# Patient Record
Sex: Female | Born: 1940 | Race: White | Hispanic: No | Marital: Married | Smoking: Never smoker
Health system: Southern US, Community
[De-identification: ages and names within clinical notes are randomized; demographics above are authoritative.]

## PROBLEM LIST (undated history)

## (undated) DIAGNOSIS — M810 Age-related osteoporosis without current pathological fracture: Secondary | ICD-10-CM

## (undated) DIAGNOSIS — E039 Hypothyroidism, unspecified: Secondary | ICD-10-CM

## (undated) DIAGNOSIS — M1990 Unspecified osteoarthritis, unspecified site: Secondary | ICD-10-CM

## (undated) DIAGNOSIS — IMO0002 Reserved for concepts with insufficient information to code with codable children: Secondary | ICD-10-CM

## (undated) DIAGNOSIS — G4700 Insomnia, unspecified: Secondary | ICD-10-CM

## (undated) DIAGNOSIS — C55 Malignant neoplasm of uterus, part unspecified: Secondary | ICD-10-CM

## (undated) DIAGNOSIS — C50919 Malignant neoplasm of unspecified site of unspecified female breast: Secondary | ICD-10-CM

## (undated) HISTORY — PX: MASTECTOMY: SHX3

## (undated) HISTORY — DX: Unspecified osteoarthritis, unspecified site: M19.90

## (undated) HISTORY — DX: Malignant neoplasm of unspecified site of unspecified female breast: C50.919

## (undated) HISTORY — DX: Age-related osteoporosis without current pathological fracture: M81.0

## (undated) HISTORY — DX: Hypothyroidism, unspecified: E03.9

## (undated) HISTORY — PX: PLACEMENT OF BREAST IMPLANTS: SHX6334

## (undated) HISTORY — DX: Reserved for concepts with insufficient information to code with codable children: IMO0002

## (undated) HISTORY — DX: Malignant neoplasm of uterus, part unspecified: C55

## (undated) HISTORY — DX: Insomnia, unspecified: G47.00

---

## 2006-04-16 ENCOUNTER — Ambulatory Visit: Payer: Self-pay | Admitting: Gastroenterology

## 2010-04-26 ENCOUNTER — Other Ambulatory Visit: Payer: Self-pay | Admitting: Family Medicine

## 2010-05-06 ENCOUNTER — Emergency Department: Payer: Self-pay | Admitting: Emergency Medicine

## 2010-08-03 ENCOUNTER — Other Ambulatory Visit: Payer: Self-pay | Admitting: Family Medicine

## 2010-12-10 LAB — HM COLONOSCOPY: HM Colonoscopy: 2007

## 2011-05-02 ENCOUNTER — Ambulatory Visit: Payer: Self-pay | Admitting: Obstetrics and Gynecology

## 2011-05-11 HISTORY — PX: ABDOMINAL HYSTERECTOMY: SHX81

## 2011-05-14 DIAGNOSIS — C541 Malignant neoplasm of endometrium: Secondary | ICD-10-CM | POA: Insufficient documentation

## 2011-06-10 ENCOUNTER — Ambulatory Visit: Payer: Self-pay | Admitting: Oncology

## 2011-06-29 ENCOUNTER — Ambulatory Visit: Payer: Self-pay | Admitting: Oncology

## 2011-07-09 ENCOUNTER — Ambulatory Visit: Payer: Self-pay | Admitting: General Surgery

## 2011-07-11 ENCOUNTER — Ambulatory Visit: Payer: Self-pay | Admitting: Oncology

## 2011-08-11 ENCOUNTER — Ambulatory Visit: Payer: Self-pay | Admitting: Oncology

## 2011-09-10 ENCOUNTER — Ambulatory Visit: Payer: Self-pay | Admitting: Oncology

## 2011-10-11 ENCOUNTER — Ambulatory Visit: Payer: Self-pay | Admitting: Oncology

## 2011-11-10 ENCOUNTER — Ambulatory Visit: Payer: Self-pay | Admitting: Oncology

## 2011-11-12 DIAGNOSIS — Z09 Encounter for follow-up examination after completed treatment for conditions other than malignant neoplasm: Secondary | ICD-10-CM | POA: Insufficient documentation

## 2011-11-29 LAB — CA 125: CA 125: 17 U/mL (ref 0.0–34.0)

## 2011-12-11 ENCOUNTER — Ambulatory Visit: Payer: Self-pay | Admitting: Oncology

## 2012-01-11 ENCOUNTER — Ambulatory Visit: Payer: Self-pay | Admitting: Oncology

## 2012-02-18 ENCOUNTER — Ambulatory Visit: Payer: Self-pay | Admitting: Oncology

## 2012-02-19 LAB — COMPREHENSIVE METABOLIC PANEL
Albumin: 3.8 g/dL (ref 3.4–5.0)
Alkaline Phosphatase: 104 U/L (ref 50–136)
Anion Gap: 10 (ref 7–16)
Calcium, Total: 9.6 mg/dL (ref 8.5–10.1)
Chloride: 103 mmol/L (ref 98–107)
EGFR (Non-African Amer.): 58 — ABNORMAL LOW
Glucose: 132 mg/dL — ABNORMAL HIGH (ref 65–99)
Osmolality: 282 (ref 275–301)
SGOT(AST): 20 U/L (ref 15–37)
SGPT (ALT): 25 U/L
Sodium: 140 mmol/L (ref 136–145)

## 2012-02-19 LAB — CBC CANCER CENTER
Basophil %: 0.6 %
Eosinophil #: 0 x10 3/mm (ref 0.0–0.7)
HCT: 37.9 % (ref 35.0–47.0)
HGB: 13.1 g/dL (ref 12.0–16.0)
Lymphocyte #: 2.1 x10 3/mm (ref 1.0–3.6)
MCH: 33 pg (ref 26.0–34.0)
MCHC: 34.5 g/dL (ref 32.0–36.0)
MCV: 96 fL (ref 80–100)
Monocyte #: 0.6 x10 3/mm (ref 0.0–0.7)
Neutrophil #: 2.6 x10 3/mm (ref 1.4–6.5)
RBC: 3.96 10*6/uL (ref 3.80–5.20)
RDW: 12.8 % (ref 11.5–14.5)
WBC: 5.3 x10 3/mm (ref 3.6–11.0)

## 2012-03-10 ENCOUNTER — Ambulatory Visit: Payer: Self-pay | Admitting: Oncology

## 2012-04-09 ENCOUNTER — Ambulatory Visit: Payer: Self-pay | Admitting: Oncology

## 2012-04-12 LAB — CEA: CEA: 2.4 ng/mL (ref 0.0–4.7)

## 2012-05-09 LAB — COMPREHENSIVE METABOLIC PANEL
Alkaline Phosphatase: 96 U/L (ref 50–136)
Bilirubin,Total: 1.1 mg/dL — ABNORMAL HIGH (ref 0.2–1.0)
EGFR (Non-African Amer.): >60
Glucose: 132 mg/dL — ABNORMAL HIGH (ref 65–99)
SGOT(AST): 19 U/L (ref 15–37)
SGPT (ALT): 20 U/L
Sodium: 140 mmol/L (ref 136–145)

## 2012-05-09 LAB — CBC WITH DIFFERENTIAL/PLATELET
Basophil %: 0.4 %
Eosinophil #: 0.1 10*3/uL (ref 0.0–0.7)
Eosinophil %: 0.8 %
HCT: 40.7 % (ref 35.0–47.0)
HGB: 13.6 g/dL (ref 12.0–16.0)
MCH: 32.2 pg (ref 26.0–34.0)
MCV: 96 fL (ref 80–100)
Monocyte #: 0.5 x10 3/mm (ref 0.2–0.9)
Monocyte %: 6.2 %
Neutrophil %: 72.1 %
RBC: 4.22 10*6/uL (ref 3.80–5.20)
RDW: 13.2 % (ref 11.5–14.5)
WBC: 7.8 10*3/uL (ref 3.6–11.0)

## 2012-05-10 ENCOUNTER — Ambulatory Visit: Payer: Self-pay

## 2012-05-10 ENCOUNTER — Ambulatory Visit: Payer: Self-pay | Admitting: Oncology

## 2012-05-10 LAB — CA 125: CA 125: 16.5 U/mL (ref 0.0–34.0)

## 2012-06-09 ENCOUNTER — Ambulatory Visit: Payer: Self-pay | Admitting: Oncology

## 2012-07-29 ENCOUNTER — Ambulatory Visit: Payer: Self-pay | Admitting: Oncology

## 2012-07-29 LAB — CBC WITH DIFFERENTIAL/PLATELET
Basophil #: 0 10*3/uL (ref 0.0–0.1)
Eosinophil %: 0.8 %
HCT: 40.4 % (ref 35.0–47.0)
HGB: 14.2 g/dL (ref 12.0–16.0)
Lymphocyte #: 1.6 10*3/uL (ref 1.0–3.6)
Lymphocyte %: 32.9 %
MCV: 94 fL (ref 80–100)
Monocyte #: 0.4 x10 3/mm (ref 0.2–0.9)
Monocyte %: 8 %
Neutrophil #: 2.9 10*3/uL (ref 1.4–6.5)
Neutrophil %: 57.4 %
RBC: 4.3 10*6/uL (ref 3.80–5.20)
RDW: 12.9 % (ref 11.5–14.5)
WBC: 5 10*3/uL (ref 3.6–11.0)

## 2012-07-29 LAB — COMPREHENSIVE METABOLIC PANEL
Albumin: 4.2 g/dL (ref 3.4–5.0)
Alkaline Phosphatase: 107 U/L (ref 50–136)
Anion Gap: 9 (ref 7–16)
BUN: 9 mg/dL (ref 7–18)
Bilirubin,Total: 1.3 mg/dL — ABNORMAL HIGH (ref 0.2–1.0)
Calcium, Total: 9.3 mg/dL (ref 8.5–10.1)
Chloride: 105 mmol/L (ref 98–107)
Creatinine: 0.69 mg/dL (ref 0.60–1.30)
EGFR (Non-African Amer.): >60
Glucose: 103 mg/dL — ABNORMAL HIGH (ref 65–99)
Osmolality: 278 (ref 275–301)
Potassium: 3.9 mmol/L (ref 3.5–5.1)
Sodium: 140 mmol/L (ref 136–145)
Total Protein: 7.3 g/dL (ref 6.4–8.2)

## 2012-07-30 LAB — CA 125: CA 125: 16.9 U/mL (ref 0.0–34.0)

## 2012-08-10 ENCOUNTER — Ambulatory Visit: Payer: Self-pay | Admitting: Oncology

## 2012-09-10 ENCOUNTER — Other Ambulatory Visit: Payer: Self-pay | Admitting: Family Medicine

## 2013-02-07 ENCOUNTER — Ambulatory Visit: Payer: Self-pay | Admitting: Oncology

## 2013-02-25 LAB — COMPREHENSIVE METABOLIC PANEL
Albumin: 3.8 g/dL (ref 3.4–5.0)
Alkaline Phosphatase: 102 U/L (ref 50–136)
Anion Gap: 10 (ref 7–16)
BUN: 12 mg/dL (ref 7–18)
Chloride: 102 mmol/L (ref 98–107)
Creatinine: 0.97 mg/dL (ref 0.60–1.30)
EGFR (African American): >60
EGFR (Non-African Amer.): 59 — ABNORMAL LOW
Glucose: 142 mg/dL — ABNORMAL HIGH (ref 65–99)
Potassium: 3.7 mmol/L (ref 3.5–5.1)
SGOT(AST): 21 U/L (ref 15–37)
SGPT (ALT): 25 U/L (ref 12–78)
Sodium: 140 mmol/L (ref 136–145)
Total Protein: 7.1 g/dL (ref 6.4–8.2)

## 2013-02-25 LAB — CBC CANCER CENTER
Basophil %: 0.8 %
Eosinophil #: 0.1 x10 3/mm (ref 0.0–0.7)
Eosinophil %: 1.2 %
Lymphocyte %: 40 %
MCH: 32.7 pg (ref 26.0–34.0)
MCHC: 34.2 g/dL (ref 32.0–36.0)
MCV: 96 fL (ref 80–100)
Neutrophil #: 2.7 x10 3/mm (ref 1.4–6.5)
Neutrophil %: 49.8 %
Platelet: 224 x10 3/mm (ref 150–440)
RDW: 13 % (ref 11.5–14.5)

## 2013-02-25 LAB — TSH: Thyroid Stimulating Horm: 2.9 u[IU]/mL

## 2013-02-26 LAB — CA 125: CA 125: 15.4 U/mL

## 2013-03-10 ENCOUNTER — Ambulatory Visit: Payer: Self-pay | Admitting: Oncology

## 2013-07-01 ENCOUNTER — Ambulatory Visit: Payer: Self-pay | Admitting: Oncology

## 2013-08-06 ENCOUNTER — Ambulatory Visit: Payer: Self-pay | Admitting: Oncology

## 2013-08-06 LAB — CBC CANCER CENTER
Eosinophil #: 0 x10 3/mm (ref 0.0–0.7)
HCT: 39.4 % (ref 35.0–47.0)
HGB: 14 g/dL (ref 12.0–16.0)
Lymphocyte #: 1.9 x10 3/mm (ref 1.0–3.6)
Lymphocyte %: 32.3 %
MCH: 33.5 pg (ref 26.0–34.0)
MCHC: 35.6 g/dL (ref 32.0–36.0)
MCV: 94 fL (ref 80–100)
Monocyte %: 7.3 %
Neutrophil #: 3.4 x10 3/mm (ref 1.4–6.5)
Platelet: 226 x10 3/mm (ref 150–440)
RBC: 4.19 10*6/uL (ref 3.80–5.20)
RDW: 12.8 % (ref 11.5–14.5)
WBC: 5.9 x10 3/mm (ref 3.6–11.0)

## 2013-08-06 LAB — COMPREHENSIVE METABOLIC PANEL
Alkaline Phosphatase: 90 U/L (ref 50–136)
Bilirubin,Total: 1.5 mg/dL — ABNORMAL HIGH (ref 0.2–1.0)
Chloride: 105 mmol/L (ref 98–107)
Creatinine: 0.87 mg/dL (ref 0.60–1.30)
EGFR (African American): >60
EGFR (Non-African Amer.): >60
Glucose: 102 mg/dL — ABNORMAL HIGH (ref 65–99)
Potassium: 4 mmol/L (ref 3.5–5.1)
SGOT(AST): 20 U/L (ref 15–37)
Total Protein: 6.8 g/dL (ref 6.4–8.2)

## 2013-08-10 ENCOUNTER — Ambulatory Visit: Payer: Self-pay | Admitting: Oncology

## 2013-09-03 ENCOUNTER — Ambulatory Visit: Payer: Medicare Other | Attending: Oncology | Admitting: Physical Therapy

## 2013-09-03 DIAGNOSIS — I890 Lymphedema, not elsewhere classified: Secondary | ICD-10-CM | POA: Insufficient documentation

## 2013-09-03 DIAGNOSIS — IMO0001 Reserved for inherently not codable concepts without codable children: Secondary | ICD-10-CM | POA: Insufficient documentation

## 2013-09-08 ENCOUNTER — Ambulatory Visit: Payer: Medicare Other | Admitting: Physical Therapy

## 2013-09-09 ENCOUNTER — Ambulatory Visit: Payer: Self-pay | Admitting: Oncology

## 2013-09-14 ENCOUNTER — Encounter: Payer: Self-pay | Admitting: Oncology

## 2013-09-15 ENCOUNTER — Encounter: Payer: Medicare Other | Admitting: Physical Therapy

## 2013-09-17 ENCOUNTER — Encounter: Payer: Medicare Other | Admitting: Physical Therapy

## 2013-09-21 ENCOUNTER — Encounter: Payer: Medicare Other | Admitting: Physical Therapy

## 2013-09-23 ENCOUNTER — Encounter: Payer: Medicare Other | Admitting: Physical Therapy

## 2013-09-25 ENCOUNTER — Emergency Department: Payer: Self-pay | Admitting: Emergency Medicine

## 2013-09-29 ENCOUNTER — Encounter: Payer: Medicare Other | Admitting: Physical Therapy

## 2013-09-29 IMAGING — US US EXTREM LOW VENOUS*L*
1 series · 14 of 22 positions shown · non-contrast
Comparison: none

REASON FOR EXAM: lt lower extr swelling  eval DVT
COMMENTS:

[Series 1: us extrem low venous*left* · 0.09mm/px · 14 of 22 slices shown]
[im 1/22]
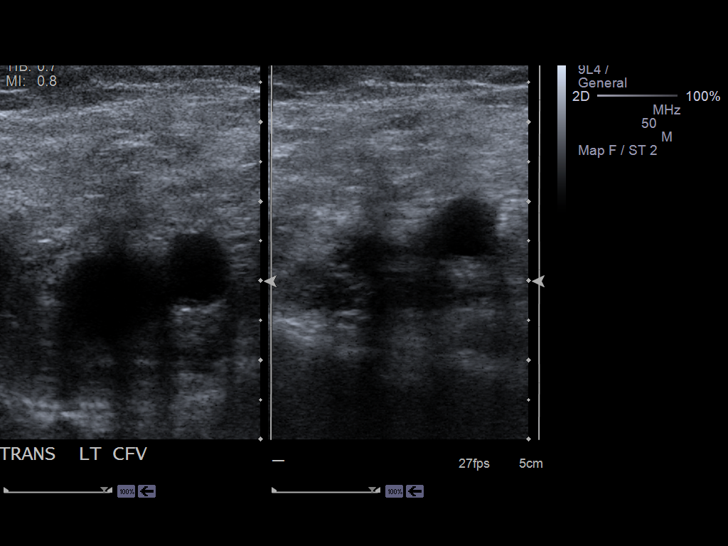
[im 3/22]
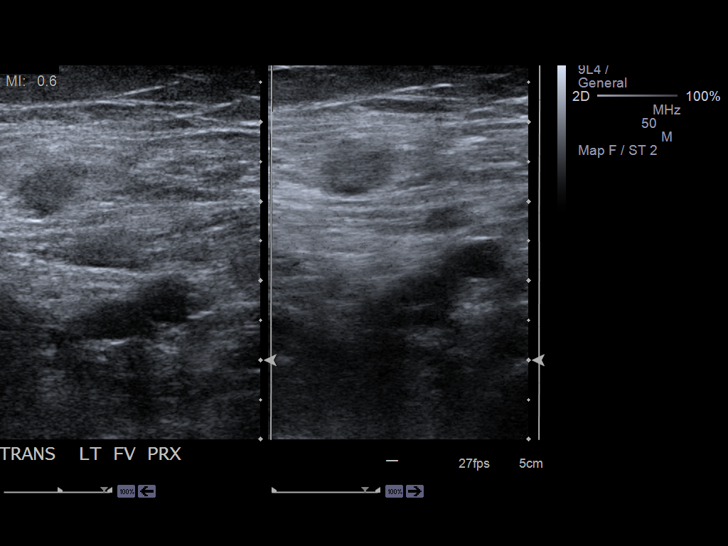
[im 4/22]
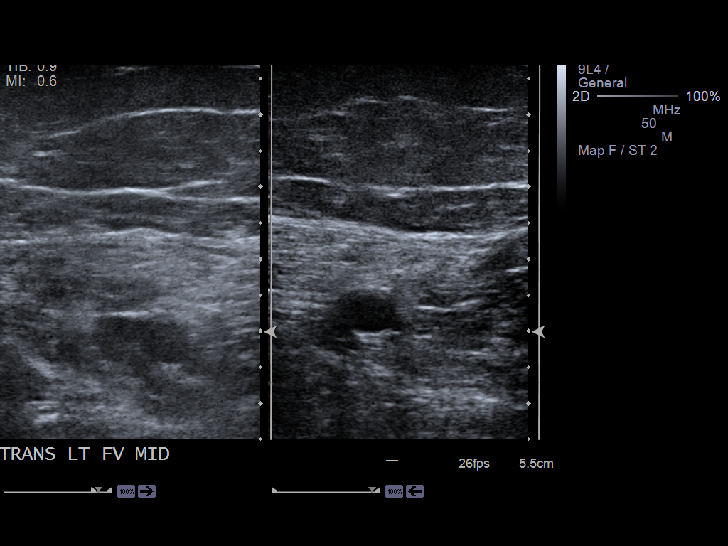
[im 6/22]
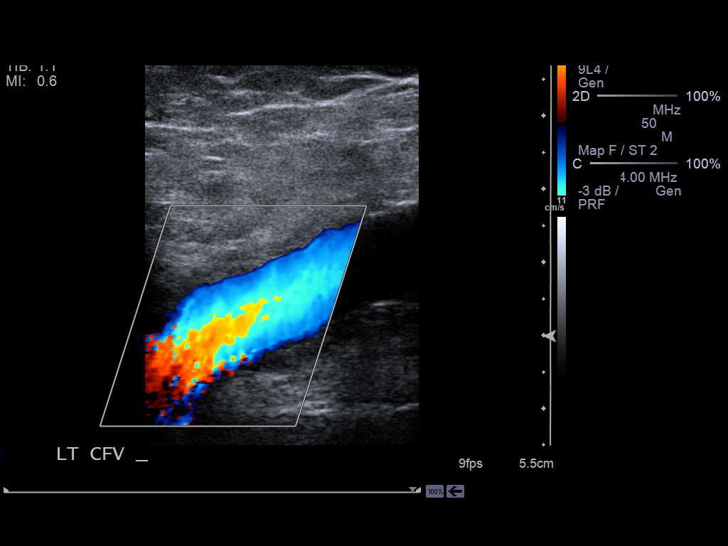
[im 8/22]
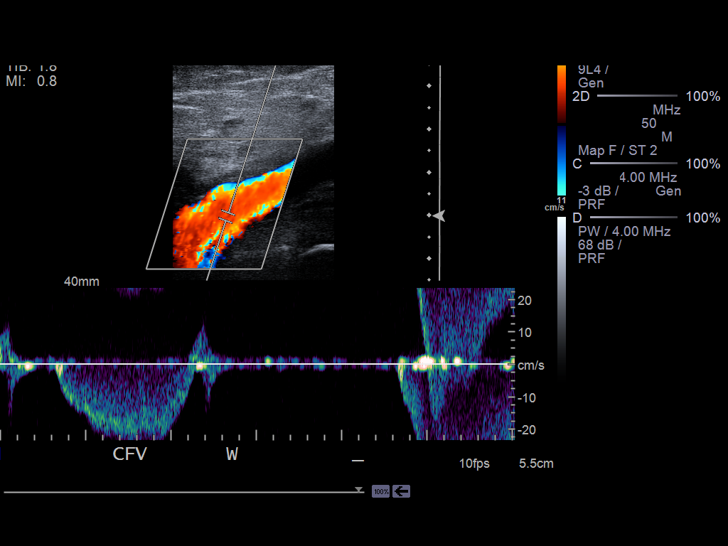
[im 9/22]
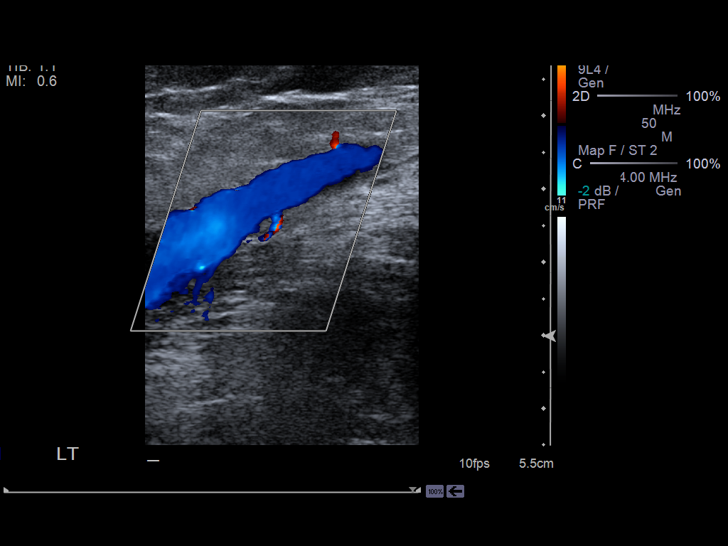
[im 11/22]
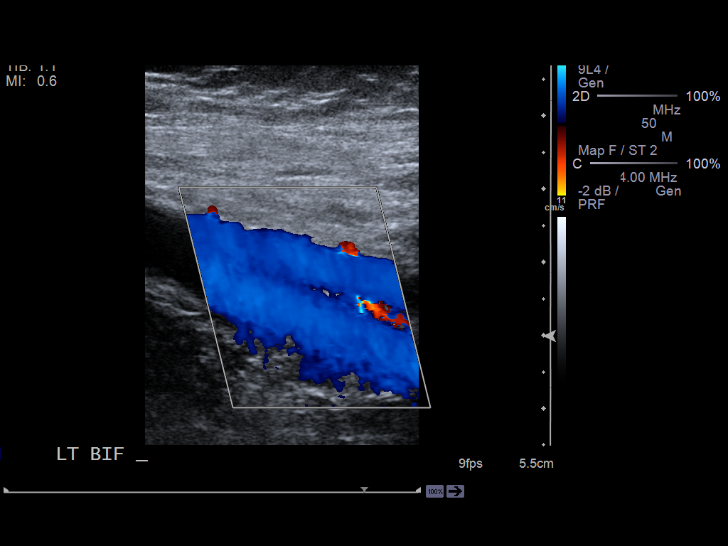
[im 12/22]
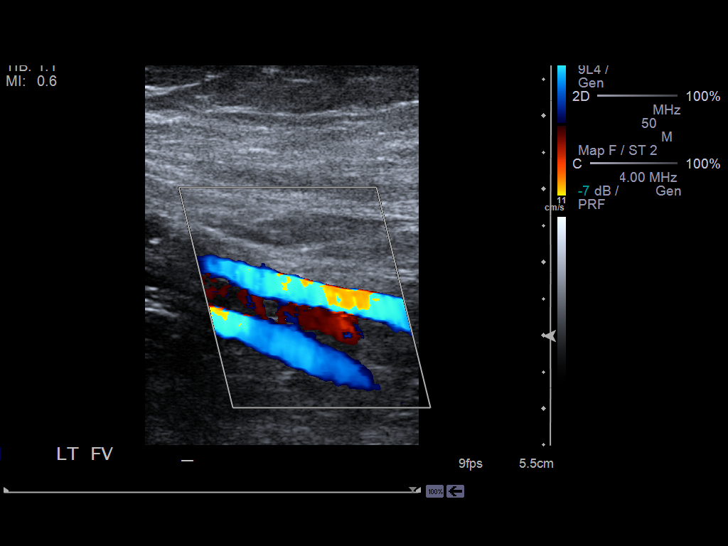
[im 14/22]
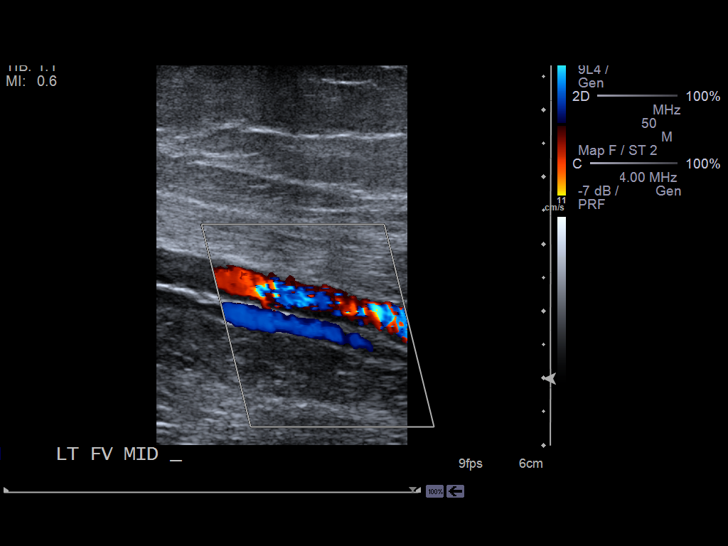
[im 15/22]
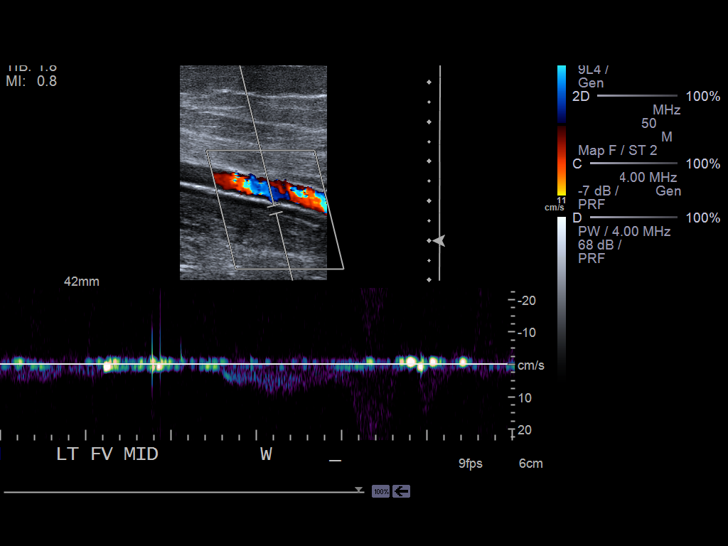
[im 17/22]
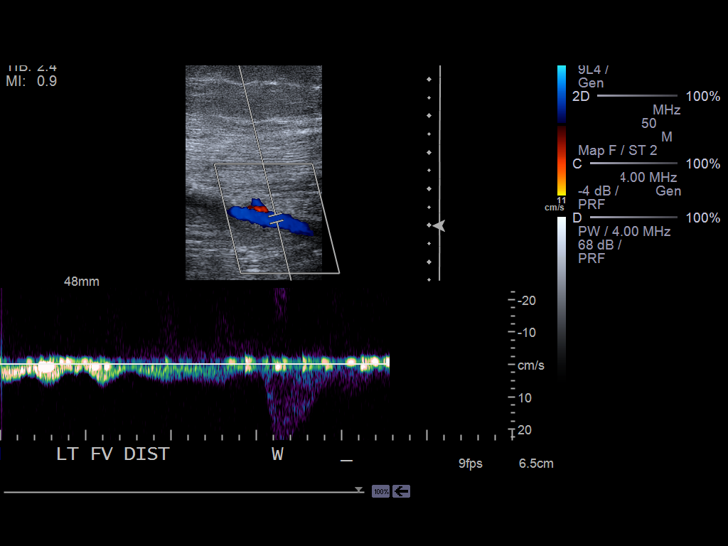
[im 19/22]
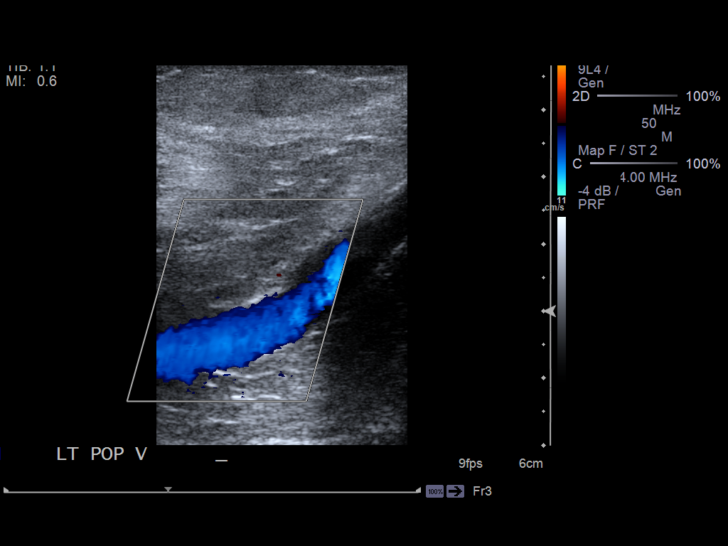
[im 20/22]
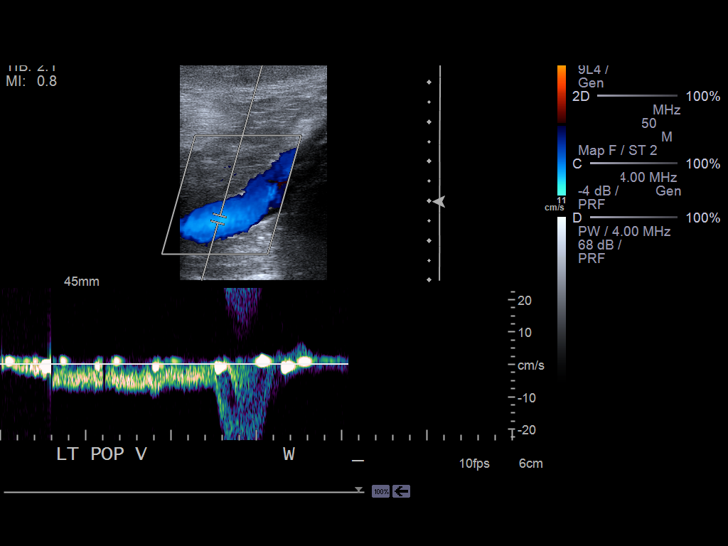
[im 22/22]
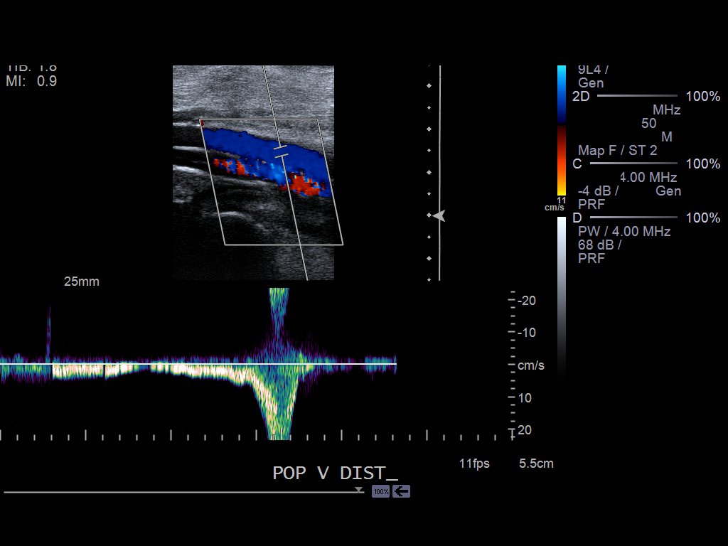

[14 of 22 positions shown; findings below may reference images not displayed]

PROCEDURE:     US  - US DOPPLER LOW EXTR LEFT  - August 06, 2013 [DATE]

RESULT:     Grayscale and color flow Doppler techniques were employed to
evaluate the deep veins of the left lower extremity.

The left common femoral, superficial femoral, and popliteal veins are
normally compressible. The waveform patterns are normal and the color flow
images are normal. The response to the augmentation and Valsalva maneuvers
is normal.
IMPRESSION: There is no evidence of thrombus within the left femoral or
popliteal veins.

[REDACTED]

## 2013-10-01 ENCOUNTER — Encounter: Payer: Medicare Other | Admitting: Physical Therapy

## 2013-10-04 IMAGING — CT CT ABD-PELV W/ CM
1 of 2 series · 15 of 32 positions shown, 19 images · non-contrast
Comparison: none

REASON FOR EXAM: endometrial CA   lymphedema of lt lower extra
COMMENTS:

PROCEDURE:     KCT - KCT ABDOMEN/PELVIS W  - August 11, 2013  [DATE]
RESULT:     History: Endometrial cancer. Lymphedema.
Comparison Study: CT 07/01/2013

[Series 2: abd 3mm w 3.0 i40f 3 · axial · 0.97mm/px · z∈[-948,-564]mm · 15 of 140 slices shown, 19 images]
[im 6/140  soft-tissue]
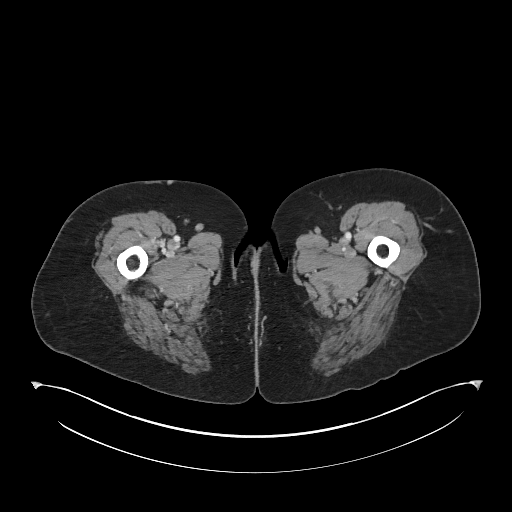
[im 6/140  bone]
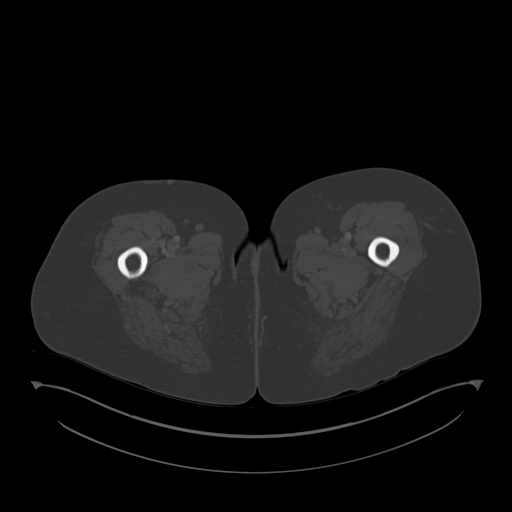
[im 17/140  soft-tissue]
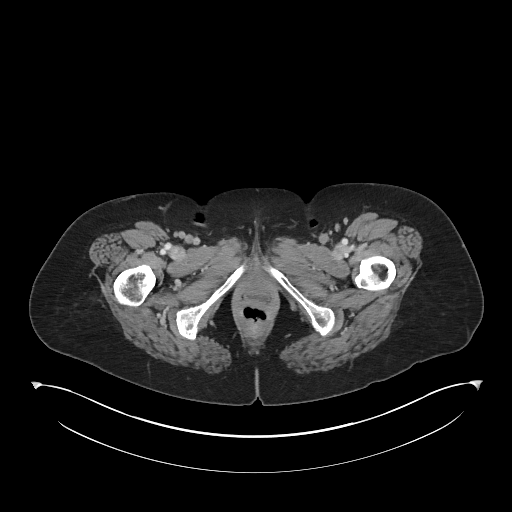
[im 28/140  soft-tissue]
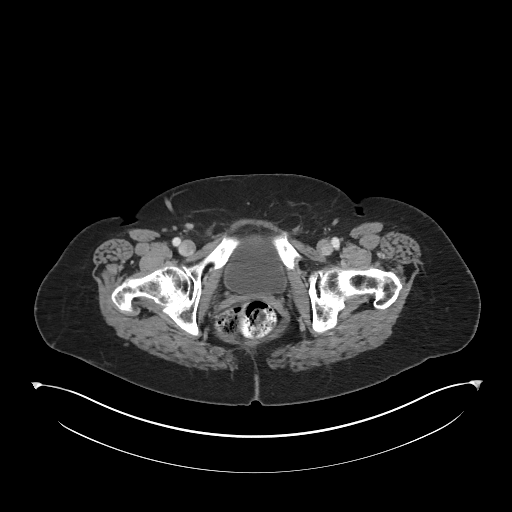
[im 39/140  soft-tissue]
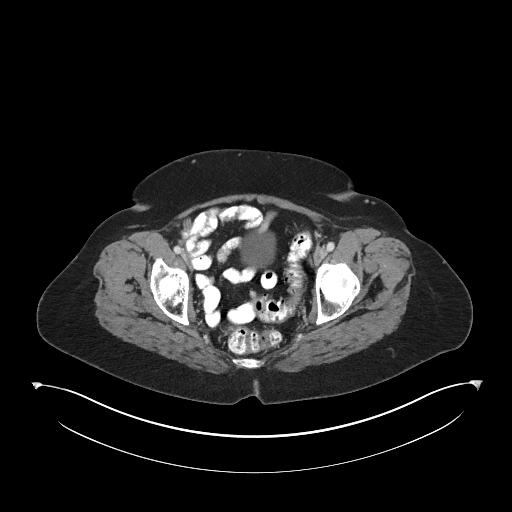
[im 51/140  soft-tissue]
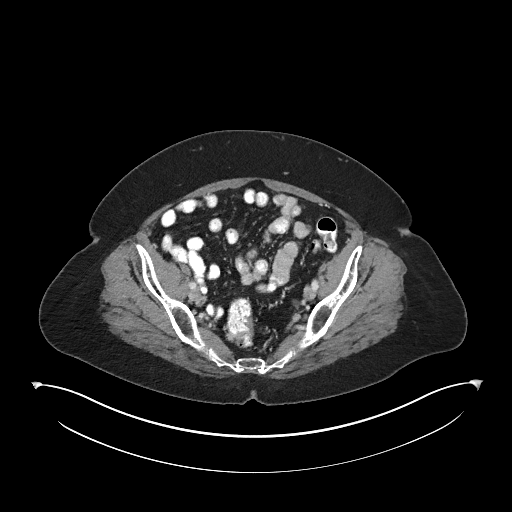
[im 62/140  soft-tissue]
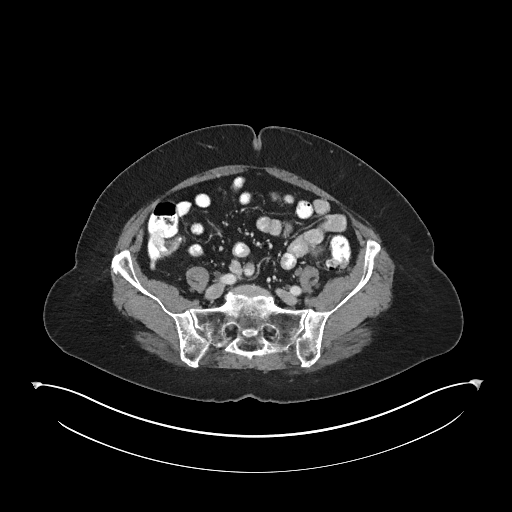
[im 73/140  soft-tissue]
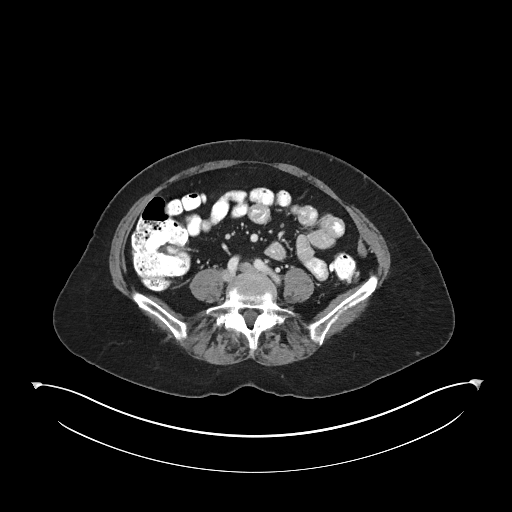
[im 78/140  soft-tissue]
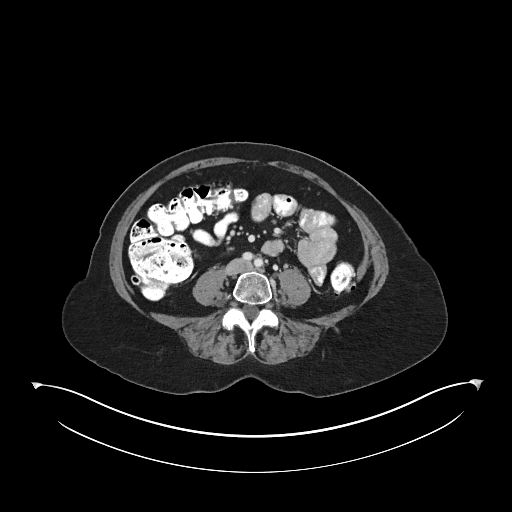
[im 89/140  soft-tissue]
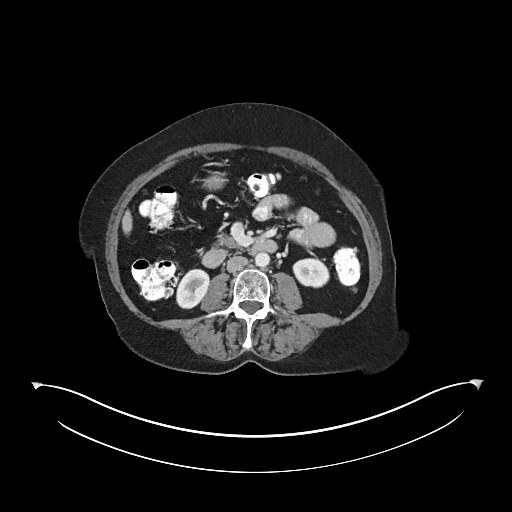
[im 89/140  bone]
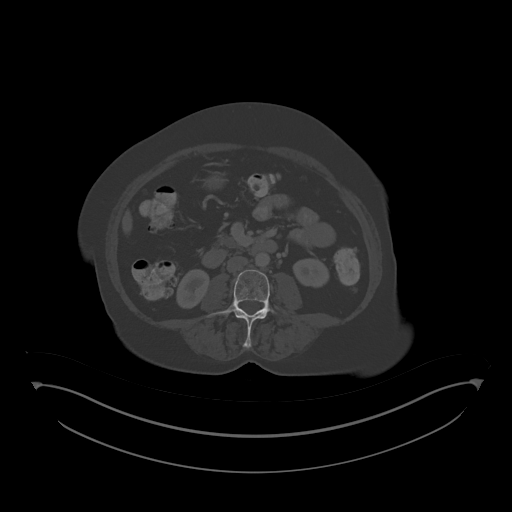
[im 101/140  soft-tissue]
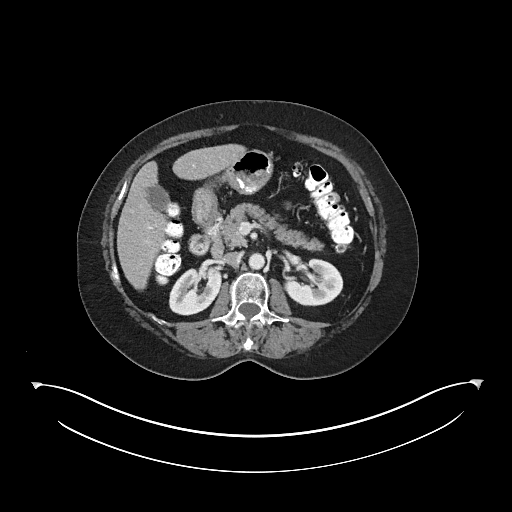
[im 112/140  soft-tissue]
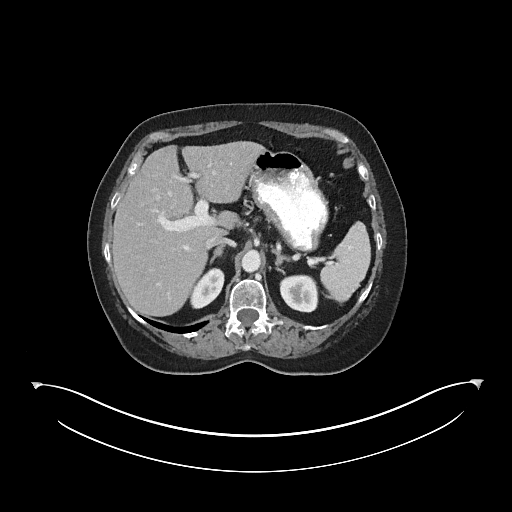
[im 117/140  lung]
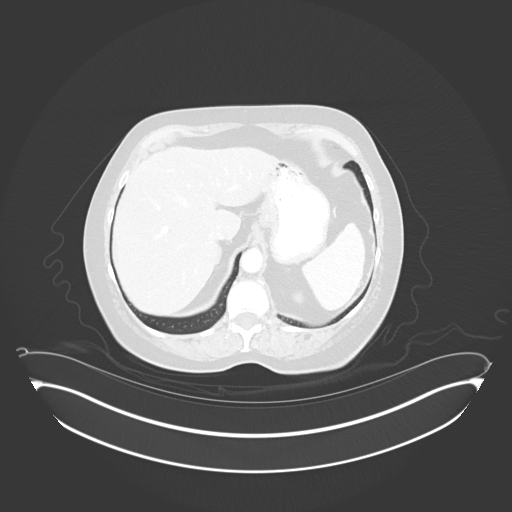
[im 123/140  soft-tissue]
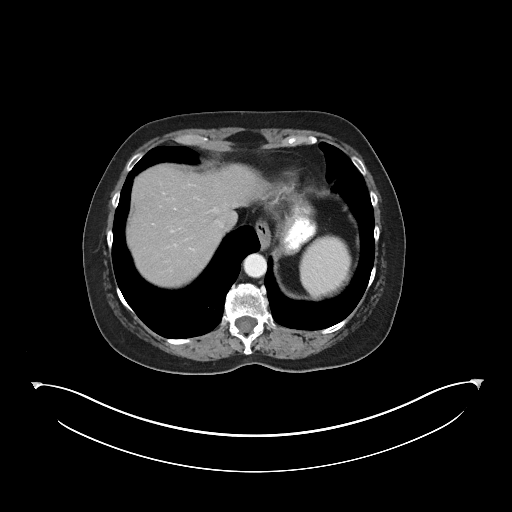
[im 123/140  lung]
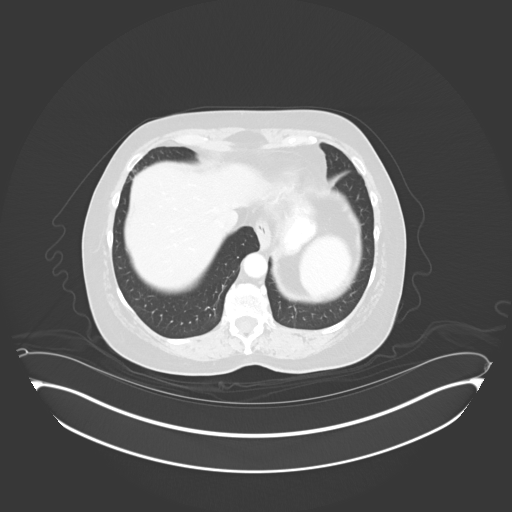
[im 128/140  lung]
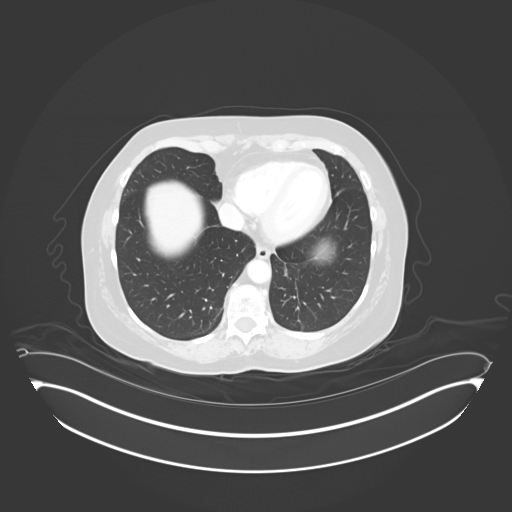
[im 134/140  soft-tissue]
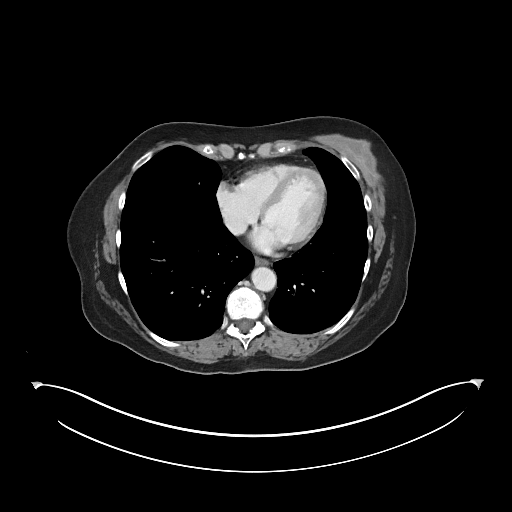
[im 134/140  lung]
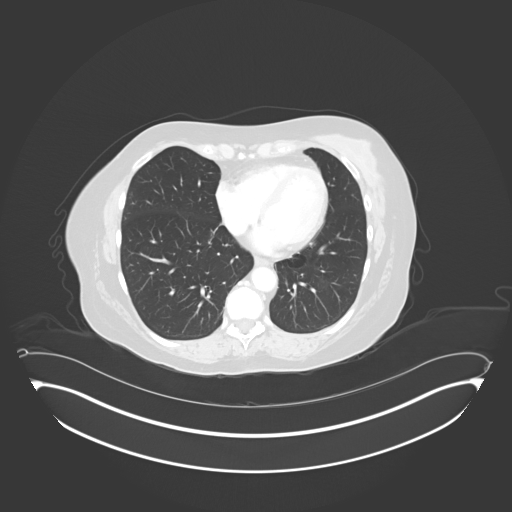

[15 of 32 positions shown; findings below may reference images not displayed]

FINDINGS: CT obtained with 100 cc of Csovue-CTT. Evaluation in 3 dimensions
on separate workstation performed. Liver is normal. Spleen is normal.
Pancreas is normal. Gallbladder is normal. No biliary distention.

Adrenals normal. Stable simple cyst lower pole left kidney. Kidneys
otherwise unremarkable. No hydronephrosis. Bladder is unremarkable.
Hysterectomy. No pelvic mass. Stable mild perirectal soft tissue thickening
is noted. This may be from scarring. Phleboliths. No free pelvic fluid.

No significant adenopathy. Aorta normal in caliber.

Appendix normal. Sigmoid diverticulosis. No diverticulitis. No bowel
obstruction. No free air.

Lung bases clear. No acute bony abnormality.
IMPRESSION: No significant abnormality. No evidence of adenopathy or
metastatic disease.

## 2013-10-10 ENCOUNTER — Encounter: Payer: Self-pay | Admitting: Oncology

## 2013-11-09 ENCOUNTER — Encounter: Payer: Self-pay | Admitting: Oncology

## 2014-01-04 ENCOUNTER — Ambulatory Visit: Payer: Self-pay | Admitting: Oncology

## 2014-01-10 ENCOUNTER — Ambulatory Visit: Payer: Self-pay | Admitting: Oncology

## 2014-05-10 DIAGNOSIS — E039 Hypothyroidism, unspecified: Secondary | ICD-10-CM | POA: Insufficient documentation

## 2014-05-26 ENCOUNTER — Ambulatory Visit: Payer: Self-pay | Admitting: Ophthalmology

## 2014-05-26 DIAGNOSIS — I499 Cardiac arrhythmia, unspecified: Secondary | ICD-10-CM

## 2014-05-26 DIAGNOSIS — Z01810 Encounter for preprocedural cardiovascular examination: Secondary | ICD-10-CM

## 2014-05-31 ENCOUNTER — Ambulatory Visit: Payer: Self-pay | Admitting: Oncology

## 2014-06-01 LAB — CBC CANCER CENTER
BASOS ABS: 0.1 x10 3/mm (ref 0.0–0.1)
Basophil %: 1.2 %
Eosinophil #: 0 x10 3/mm (ref 0.0–0.7)
Eosinophil %: 0.6 %
HCT: 41.9 % (ref 35.0–47.0)
HGB: 14.3 g/dL (ref 12.0–16.0)
LYMPHS ABS: 2.3 x10 3/mm (ref 1.0–3.6)
Lymphocyte %: 31.3 %
MCH: 32.1 pg (ref 26.0–34.0)
MCHC: 34.1 g/dL (ref 32.0–36.0)
MCV: 94 fL (ref 80–100)
MONOS PCT: 8.9 %
Monocyte #: 0.6 x10 3/mm (ref 0.2–0.9)
NEUTROS ABS: 4.2 x10 3/mm (ref 1.4–6.5)
Neutrophil %: 58 %
Platelet: 251 x10 3/mm (ref 150–440)
RBC: 4.46 10*6/uL (ref 3.80–5.20)
RDW: 13.1 % (ref 11.5–14.5)
WBC: 7.3 x10 3/mm (ref 3.6–11.0)

## 2014-06-01 LAB — COMPREHENSIVE METABOLIC PANEL
Albumin: 4.2 g/dL (ref 3.4–5.0)
Alkaline Phosphatase: 99 U/L
Anion Gap: 7 (ref 7–16)
BUN: 16 mg/dL (ref 7–18)
Bilirubin,Total: 1.4 mg/dL — ABNORMAL HIGH (ref 0.2–1.0)
CALCIUM: 10.3 mg/dL — AB (ref 8.5–10.1)
CHLORIDE: 104 mmol/L (ref 98–107)
Co2: 27 mmol/L (ref 21–32)
Creatinine: 0.95 mg/dL (ref 0.60–1.30)
EGFR (African American): >60
EGFR (Non-African Amer.): 59 — ABNORMAL LOW
Glucose: 95 mg/dL (ref 65–99)
Osmolality: 277 (ref 275–301)
Potassium: 4.2 mmol/L (ref 3.5–5.1)
SGOT(AST): 29 U/L (ref 15–37)
SGPT (ALT): 20 U/L (ref 12–78)
SODIUM: 138 mmol/L (ref 136–145)
TOTAL PROTEIN: 7.3 g/dL (ref 6.4–8.2)

## 2014-06-01 LAB — TSH: THYROID STIMULATING HORM: 1.3 u[IU]/mL

## 2014-06-02 LAB — CA 125: CA 125: 17.5 U/mL (ref 0.0–34.0)

## 2014-06-07 ENCOUNTER — Ambulatory Visit: Payer: Self-pay | Admitting: Ophthalmology

## 2014-06-09 ENCOUNTER — Ambulatory Visit: Payer: Self-pay | Admitting: Oncology

## 2014-07-19 ENCOUNTER — Ambulatory Visit: Payer: Self-pay | Admitting: Ophthalmology

## 2015-04-02 NOTE — Op Note (Signed)
PATIENT NAME:  [name], Cynthia B MR#:  [id number] DATE OF BIRTH:  03-Aug-1941  DATE OF PROCEDURE:  06/07/2014  PREOPERATIVE DIAGNOSIS:  Cataract, left eye.    POSTOPERATIVE DIAGNOSIS:  Cataract, left eye.  PROCEDURE PERFORMED:  Extracapsular cataract extraction using phacoemulsification with placement of an Alcon SN6CWF, 17.0-diopter posterior chamber lens, serial #[id number].  SURGEON:  Loura Back. Morna Flud, MD  ASSISTANT:  None.  ANESTHESIA: 4% lidocaine and 0.75% Marcaine in a 50/50 mixture with 10 units/mL of Hylenex added, given as a peribulbar.   ANESTHESIOLOGIST:  Dr. Julianne Handler   COMPLICATIONS:  None.  ESTIMATED BLOOD LOSS:  Less than 1 ml.  DESCRIPTION OF PROCEDURE:  The patient was brought to the operating room and given a peribulbar block.  The patient was then prepped and draped in the usual fashion.  The vertical rectus muscles were imbricated using 5-0 silk sutures.  These sutures were then clamped to the sterile drapes as bridle sutures.  A limbal peritomy was performed extending two clock hours and hemostasis was obtained with cautery.  A partial thickness scleral groove was made at the surgical limbus and dissected anteriorly in a lamellar dissection using an Alcon crescent knife.  The anterior chamber was entered supero-temporally with a Superblade and through the lamellar dissection with a 2.6 mm keratome.  DisCoVisc was used to replace the aqueous and a continuous tear capsulorrhexis was carried out.  Hydrodissection and hydrodelineation were carried out with balanced salt and a 27 gauge canula.  The nucleus was rotated to confirm the effectiveness of the hydrodissection.  Phacoemulsification was carried out using a divide-and-conquer technique.  Total ultrasound time was 2 minutes and 16.4 seconds with an average power of 24.2 percent and CDE of 50.22.  Irrigation/aspiration was used to remove the residual cortex.  DisCoVisc was used to inflate the capsule and  the internal incision was enlarged to 3 mm with the crescent knife.  The intraocular lens was folded and inserted into the capsular bag using the AcrySert delivery system. Irrigation/aspiration was used to remove the residual DisCoVisc.  Miostat was injected into the anterior chamber through the paracentesis track to inflate the anterior chamber and induce miosis. A tenth of a milliliter of cefuroxime was injected via the paracentesis tract containing 1 mg of drug. The wound was checked for leaks and none were found. The conjunctiva was closed with cautery and the bridle sutures were removed.  Two drops of 0.3% Vigamox were placed on the eye.   An eye shield was placed on the eye.  The patient was discharged to the recovery room in good condition.   ____________________________ Loura Back Dearies Meikle, MD sad:sb D: 06/07/2014 13:25:37 ET T: 06/07/2014 13:46:08 ET JOB#: [id number]  cc: Remo Lipps A. Vail Basista, MD, <Dictator> Martie Lee MD ELECTRONICALLY SIGNED 06/09/2014 11:28

## 2015-04-02 NOTE — Op Note (Signed)
PATIENT NAME:  Cynthia [name], Cynthia [name] MR#:  [id number] DATE OF BIRTH:  [birth year]  DATE OF PROCEDURE:  07/19/2014  PREOPERATIVE DIAGNOSIS: Cataract, right eye.   POSTOPERATIVE DIAGNOSIS: Cataract, right eye.  PROCEDURE PERFORMED: Extracapsular cataract extraction using phacoemulsification with placement of Alcon SN6CWF, 17.5 diopter posterior chamber lens, serial number [id number].   SURGEON: Loura Back. Wane Mollett, MD   ANESTHESIA: 4% lidocaine and 0.75% Marcaine, 50-50 mixture of 10 units/mL with Hylenex  added given as a peribulbar.   ANESTHESIOLOGIST:  Gunnar Bulla, MD  COMPLICATIONS: None.   ESTIMATED BLOOD LOSS: Less than 1 mL.   DESCRIPTION OF PROCEDURE:  The patient was brought to the operating room and given a peribulbar block.  The patient was then prepped and draped in the usual fashion.  The vertical rectus muscles were imbricated using 5-0 silk sutures.  These sutures were then clamped to the sterile drapes as bridle sutures.  A limbal peritomy was performed extending two clock hours and hemostasis was obtained with cautery.  A partial thickness scleral groove was made at the surgical limbus and then dissected anteriorly in a lamellar dissection with using an Alcon crescent knife.  The anterior chamber was entered superonasally with a Superblade and through the lamellar dissection with a 2.6-mm keratome.  DisCoVisc was used to replace the aqueous and a continuous tear capsulorrhexis was carried out.  Hydrodissection and hydrodelineation were carried out with balanced salt and a 27 gauge canula.  The nucleus was rotated to confirm the effectiveness of the hydrodissection.  Phacoemulsification was carried out using a divide-and-conquer technique.  Total ultrasound time was 1 minute and 11 seconds with an average power of  24.4% percent, a CDE of 27.98. No suture was placed.   Irrigation/aspiration was used to remove the residual cortex.  DisCoVisc was used to inflate the capsule and the  internal wound was enlarged to 3 mm with the crescent knife.  The intraocular lens was inserted into the capsular bag using the AcrySert delivery system was used instead of [employer].  Irrigation/aspiration was used to remove the residual DisCoVisc.  Miostat was injected into the anterior chamber through the paracentesis track to inflate the anterior chamber and induce miosis. One-tenth millimeter of cefuroxime was injected via the paracentesis tract containing 1 mg of drug. The wound was checked for leaks and wound leakage was found.  A single 10-0 suture was placed across the incision, tied and the knot was rotated superiorly.  The conjunctiva was closed with cautery and the bridle sutures were removed.  Two drops of 0.3% Vigamox were placed on the eye.  An eye shield was placed on the eye.  The patient was discharged to the recovery room in good condition.   ____________________________ Loura Back Alcide Memoli, MD sad:lb D: 07/19/2014 12:52:07 ET T: 07/19/2014 17:04:32 ET JOB#: [id number]  cc: Remo Lipps A. Ariele Vidrio, MD, <Dictator> Martie Lee MD ELECTRONICALLY SIGNED 07/26/2014 13:27

## 2015-05-31 ENCOUNTER — Telehealth: Payer: Self-pay | Admitting: *Deleted

## 2015-05-31 ENCOUNTER — Other Ambulatory Visit: Payer: Self-pay

## 2015-05-31 ENCOUNTER — Ambulatory Visit: Payer: Self-pay | Admitting: Oncology

## 2015-05-31 DIAGNOSIS — E039 Hypothyroidism, unspecified: Secondary | ICD-10-CM

## 2015-05-31 NOTE — Telephone Encounter (Signed)
T4 TSH ordered by Dr Oliva Bustard

## 2015-06-01 ENCOUNTER — Other Ambulatory Visit: Payer: Self-pay | Admitting: *Deleted

## 2015-06-01 DIAGNOSIS — C55 Malignant neoplasm of uterus, part unspecified: Secondary | ICD-10-CM

## 2015-06-02 ENCOUNTER — Inpatient Hospital Stay (HOSPITAL_BASED_OUTPATIENT_CLINIC_OR_DEPARTMENT_OTHER): Payer: Medicare Other | Admitting: Oncology

## 2015-06-02 ENCOUNTER — Inpatient Hospital Stay: Payer: Medicare Other | Attending: Oncology

## 2015-06-02 VITALS — BP 132/85 | HR 65 | Temp 97.2°F | Wt 136.9 lb

## 2015-06-02 DIAGNOSIS — Z853 Personal history of malignant neoplasm of breast: Secondary | ICD-10-CM | POA: Insufficient documentation

## 2015-06-02 DIAGNOSIS — Z9013 Acquired absence of bilateral breasts and nipples: Secondary | ICD-10-CM

## 2015-06-02 DIAGNOSIS — E039 Hypothyroidism, unspecified: Secondary | ICD-10-CM

## 2015-06-02 DIAGNOSIS — Z79899 Other long term (current) drug therapy: Secondary | ICD-10-CM

## 2015-06-02 DIAGNOSIS — Z8589 Personal history of malignant neoplasm of other organs and systems: Secondary | ICD-10-CM

## 2015-06-02 DIAGNOSIS — Z923 Personal history of irradiation: Secondary | ICD-10-CM | POA: Diagnosis not present

## 2015-06-02 DIAGNOSIS — C541 Malignant neoplasm of endometrium: Secondary | ICD-10-CM

## 2015-06-02 DIAGNOSIS — Z9221 Personal history of antineoplastic chemotherapy: Secondary | ICD-10-CM | POA: Diagnosis not present

## 2015-06-02 DIAGNOSIS — C55 Malignant neoplasm of uterus, part unspecified: Secondary | ICD-10-CM

## 2015-06-02 LAB — COMPREHENSIVE METABOLIC PANEL
ALK PHOS: 77 U/L (ref 38–126)
ALT: 16 U/L (ref 14–54)
ANION GAP: 6 (ref 5–15)
AST: 23 U/L (ref 15–41)
Albumin: 3.9 g/dL (ref 3.5–5.0)
BILIRUBIN TOTAL: 1.4 mg/dL — AB (ref 0.3–1.2)
BUN: 12 mg/dL (ref 6–20)
CHLORIDE: 105 mmol/L (ref 101–111)
CO2: 26 mmol/L (ref 22–32)
Calcium: 8.7 mg/dL — ABNORMAL LOW (ref 8.9–10.3)
Creatinine, Ser: 0.69 mg/dL (ref 0.44–1.00)
GFR calc Af Amer: >60 mL/min (ref >60)
GFR calc non Af Amer: >60 mL/min (ref >60)
Glucose, Bld: 105 mg/dL — ABNORMAL HIGH (ref 65–99)
Potassium: 4.1 mmol/L (ref 3.5–5.1)
SODIUM: 137 mmol/L (ref 135–145)
Total Protein: 6.8 g/dL (ref 6.5–8.1)

## 2015-06-02 LAB — CBC WITH DIFFERENTIAL/PLATELET
BASOS ABS: 0.1 10*3/uL (ref 0–0.1)
BASOS PCT: 1 %
Eosinophils Absolute: 0.1 10*3/uL (ref 0–0.7)
Eosinophils Relative: 2 %
HCT: 41 % (ref 35.0–47.0)
Hemoglobin: 13.6 g/dL (ref 12.0–16.0)
LYMPHS PCT: 38 %
Lymphs Abs: 2 10*3/uL (ref 1.0–3.6)
MCH: 31.3 pg (ref 26.0–34.0)
MCHC: 33.2 g/dL (ref 32.0–36.0)
MCV: 94.5 fL (ref 80.0–100.0)
MONO ABS: 0.5 10*3/uL (ref 0.2–0.9)
Monocytes Relative: 9 %
Neutro Abs: 2.6 10*3/uL (ref 1.4–6.5)
Neutrophils Relative %: 50 %
PLATELETS: 241 10*3/uL (ref 150–440)
RBC: 4.34 MIL/uL (ref 3.80–5.20)
RDW: 13 % (ref 11.5–14.5)
WBC: 5.3 10*3/uL (ref 3.6–11.0)

## 2015-06-02 LAB — TSH: TSH: 0.887 u[IU]/mL (ref 0.350–4.500)

## 2015-06-02 NOTE — Progress Notes (Signed)
Patient does not have living will.  Never smoked. 

## 2015-06-03 LAB — CA 125: CA 125: 15 U/mL (ref 0.0–38.1)

## 2015-06-03 LAB — T4: T4 TOTAL: 11.8 ug/dL (ref 4.5–12.0)

## 2015-06-06 ENCOUNTER — Encounter: Payer: Self-pay | Admitting: Oncology

## 2015-06-06 NOTE — Progress Notes (Signed)
Cynthia [name] @ [facility] [phone]  Fax:([phone]) [place]: 08/28/1941  MR#: [id number]  ZHY#:[id number]  Patient Care Team: [name], MD as PCP - General (Family Medicine)  CHIEF COMPLAINT:  Chief Complaint  Patient presents with  . Follow-up    Oncology History   Carcinoma of uterusStatus post hysterectomy and bilateral salpingo-oophorectomy, pelvic and aortic lymph node dissection.  (June 15 of 2012) surgery was done at [facility] by Dr. Clarene Essex.  STARTED ON CHEMOTHERAPY WITH CARBOPLATINUM AND TAXOL.  IN [place] OF 2012 patient finished carboplatinum and Taxol in November of 2012   Also had a brachii therapy site treated vaginal cuff Tumor dose 2350 cGy in 5 fractions Start date 08/31/11 Complete date October 50,012 Using iridium 192 high-dose rate remote afterloading BrachyVision treatment planning  2.  History of carcinoma of breast in 1987, and in 1988, bilateral mastectomy, followed by 5 years of anti-hormonal treatment with tamoxifen.     Cancer of endometrium   05/14/2011 Initial Diagnosis Cancer of endometrium    No flowsheet data found.  INTERVAL HISTORY: .  74 year old lady with history of carcinoma of the endometrium previous history of carcinoma of breast.  Came today for the follow-up.  No chills.  No fever.  No nausea.  No vomiting.  No diarrhea.  Appetite is improved.  Patient speaks followed by GYN oncologist on regular basis REVIEW OF SYSTEMS:   GENERAL:  Feels good.  Active.  No fevers, sweats or weight loss. PERFORMANCE STATUS (ECOG):  0 HEENT:  No visual changes, runny nose, sore throat, mouth sores or tenderness. Lungs: No shortness of breath or cough.  No hemoptysis. Cardiac:  No chest pain, palpitations, orthopnea, or PND. GI:  No nausea, vomiting, diarrhea, constipation, melena or hematochezia. GU:  No urgency, frequency, dysuria, or hematuria. Musculoskeletal:  No back pain.  No joint pain.  No muscle  tenderness. Extremities:  No pain or swelling. Skin:  No rashes or skin changes. Neuro:  No headache, numbness or weakness, balance or coordination issues. Endocrine:  No diabetes, thyroid issues, hot flashes or night sweats. Psych:  No mood changes, depression or anxiety. Pain:  No focal pain. Review of systems:  All other systems reviewed and found to be negative. As per HPI. Otherwise, a complete review of systems is negatve.  Significant History/PMH:   Endometrial Cancer:    Breast Cancer:    Hypothyroidism:    Mastectomy:   Preventive Screening:  Has patient had any of the following test? Colonscopy  Mammography (1)   Last Colonoscopy: 2010(1)   Last Mammography: bilateral mastectomy in 1988(1)   Smoking History: Smoking History Never Smoked.(1)  PFSH: Comments: No family history of colorectal cancer, breast cancer, or ovarian cancer.  Comments: Does not smoke, does not drink.  Patient works in [employer] Past Medical and Surgical History: Bilateral mastectomy for carcinoma of breast  Hyperthyroidism status post radioactive iodine ablation treatment   ADVANCED DIRECTIVES: This and does not have any advance care directive.  Information was given to her  HEALTH MAINTENANCE: History  Substance Use Topics  . Smoking status: Never Smoker   . Smokeless tobacco: Not on file  . Alcohol Use: Not on file      Allergies  Allergen Reactions  . Sulfa Antibiotics Nausea Only    Current Outpatient Prescriptions  Medication Sig Dispense Refill  . Cholecalciferol (VITAMIN D3) 5000 UNITS TABS Take by mouth.    . Influenza Vac Typ  A&B Surf Ant (FLUVIRIN) SUSP     . levothyroxine (SYNTHROID) 50 MCG tablet     . levothyroxine (SYNTHROID) 88 MCG tablet Take by mouth.    . SUPER B COMPLEX & C TABS Take 150 mg by mouth.    . zolpidem (AMBIEN) 10 MG tablet     . calcium carbonate (OS-CAL - DOSED IN MG OF ELEMENTAL CALCIUM) 1250 (500 CA) MG tablet Take by mouth.    .  meloxicam (MOBIC) 15 MG tablet Take 15 mg by mouth.     No current facility-administered medications for this visit.    OBJECTIVE:  Filed Vitals:   06/02/15 0958  BP: 132/85  Pulse: 65  Temp: 97.2 F (36.2 C)     There is no height on file to calculate BMI.    ECOG FS:0 - Asymptomatic  PHYSICAL EXAM: GENERAL:  Well developed, well nourished, sitting comfortably in the exam room in no acute distress. MENTAL STATUS:  Alert and oriented to person, place and time. HEAD: Normocephalic, atraumatic, face symmetric, no Cushingoid features. EYES.  Pupils equal round and reactive to light and accomodation.  No conjunctivitis or scleral icterus. ENT:  Oropharynx clear without lesion.  Tongue normal. Mucous membranes moist.  RESPIRATORY:  Clear to auscultation without rales, wheezes or rhonchi. CARDIOVASCULAR:  Regular rate and rhythm without murmur, rub or gallop. BREAST:  Bilateral mastectomy.  No evidence of recurrent disease ABDOMEN:  Soft, non-tender, with active bowel sounds, and no hepatosplenomegaly.  No masses. BACK:  No CVA tenderness.  No tenderness on percussion of the back or rib cage. SKIN:  No rashes, ulcers or lesions. EXTREMITIES: No edema, no skin discoloration or tenderness.  No palpable cords. LYMPH NODES: No palpable cervical, supraclavicular, axillary or inguinal adenopathy  NEUROLOGICAL: Unremarkable. PSYCH:  Appropriate.   LAB RESULTS:  Appointment on 06/02/2015  Component Date Value Ref Range Status  . TSH 06/02/2015 0.887  0.350 - 4.500 uIU/mL Final  . T4, Total 06/02/2015 11.8  4.5 - 12.0 ug/dL Final   Comment: (NOTE) Performed At: [facility] [place], [place] [id number] Lindon Romp MD [id number]   . WBC 06/02/2015 5.3  3.6 - 11.0 K/uL Final  . RBC 06/02/2015 4.34  3.80 - 5.20 MIL/uL Final  . Hemoglobin 06/02/2015 13.6  12.0 - 16.0 g/dL Final  . HCT 06/02/2015 41.0  35.0 - 47.0 % Final  . MCV 06/02/2015 94.5  80.0 - 100.0  fL Final  . MCH 06/02/2015 31.3  26.0 - 34.0 pg Final  . MCHC 06/02/2015 33.2  32.0 - 36.0 g/dL Final  . RDW 06/02/2015 13.0  11.5 - 14.5 % Final  . Platelets 06/02/2015 241  150 - 440 K/uL Final  . Neutrophils Relative % 06/02/2015 50   Final  . Neutro Abs 06/02/2015 2.6  1.4 - 6.5 K/uL Final  . Lymphocytes Relative 06/02/2015 38   Final  . Lymphs Abs 06/02/2015 2.0  1.0 - 3.6 K/uL Final  . Monocytes Relative 06/02/2015 9   Final  . Monocytes Absolute 06/02/2015 0.5  0.2 - 0.9 K/uL Final  . Eosinophils Relative 06/02/2015 2   Final  . Eosinophils Absolute 06/02/2015 0.1  0 - 0.7 K/uL Final  . Basophils Relative 06/02/2015 1   Final  . Basophils Absolute 06/02/2015 0.1  0 - 0.1 K/uL Final  . Sodium 06/02/2015 137  135 - 145 mmol/L Final  . Potassium 06/02/2015 4.1  3.5 - 5.1 mmol/L Final  . Chloride 06/02/2015 105  101 - 111 mmol/L Final  . CO2 06/02/2015 26  22 - 32 mmol/L Final  . Glucose, Bld 06/02/2015 105* 65 - 99 mg/dL Final  . BUN 06/02/2015 12  6 - 20 mg/dL Final  . Creatinine, Ser 06/02/2015 0.69  0.44 - 1.00 mg/dL Final  . Calcium 06/02/2015 8.7* 8.9 - 10.3 mg/dL Final  . Total Protein 06/02/2015 6.8  6.5 - 8.1 g/dL Final  . Albumin 06/02/2015 3.9  3.5 - 5.0 g/dL Final  . AST 06/02/2015 23  15 - 41 U/L Final  . ALT 06/02/2015 16  14 - 54 U/L Final  . Alkaline Phosphatase 06/02/2015 77  38 - 126 U/L Final  . Total Bilirubin 06/02/2015 1.4* 0.3 - 1.2 mg/dL Final  . GFR calc non Af Amer 06/02/2015 >60  >60 mL/min Final  . GFR calc Af Amer 06/02/2015 >60  >60 mL/min Final   Comment: (NOTE) The eGFR has been calculated using the CKD EPI equation. This calculation has not been validated in all clinical situations. eGFR's persistently <60 mL/min signify possible Chronic Kidney Disease.   . Anion gap 06/02/2015 6  5 - 15 Final  . CA 125 06/02/2015 15.0  0.0 - 38.1 U/mL Final   Comment: (NOTE) Roche ECLIA methodology Performed At: [facility] [address]  [place], [place] [id number] Lindon Romp MD MB:[id number]       STUDIES: No results found.  ASSESSMENT: 1.  Carcinoma of endometrium status postresection chemotherapy and brachii therapy there is no evidence of recurrent disease.  Tumor markers are normal. 2.  Previous history of bilateral carcinoma of breast no evidence of recurrent disease status post bilateral mastectomy  MEDICAL DECISION MAKING:  Continue yearly follow-up.  Patient was advised to continue follow-up with primary care physician for routine health maintenance Bilirubin is slightly elevated but unchanged from the previous multiple examination  Patient expressed understanding and was in agreement with this plan. She also understands that She can call clinic at any time with any questions, concerns, or complaints.    No matching staging information was found for the patient.  Forest Gleason, MD   06/06/2015 8:28 AM

## 2015-06-29 ENCOUNTER — Ambulatory Visit: Payer: Self-pay | Admitting: Obstetrics and Gynecology

## 2015-07-06 ENCOUNTER — Ambulatory Visit: Payer: Self-pay | Admitting: Obstetrics and Gynecology

## 2015-07-27 ENCOUNTER — Encounter: Payer: Self-pay | Admitting: Obstetrics and Gynecology

## 2015-07-27 ENCOUNTER — Ambulatory Visit (INDEPENDENT_AMBULATORY_CARE_PROVIDER_SITE_OTHER): Payer: Medicare Other | Admitting: Obstetrics and Gynecology

## 2015-07-27 VITALS — BP 129/77 | HR 109 | Ht 65.0 in | Wt 132.9 lb

## 2015-07-27 DIAGNOSIS — N898 Other specified noninflammatory disorders of vagina: Secondary | ICD-10-CM | POA: Diagnosis not present

## 2015-07-27 DIAGNOSIS — C541 Malignant neoplasm of endometrium: Secondary | ICD-10-CM

## 2015-07-27 NOTE — Progress Notes (Signed)
Patient ID: Cynthia [name], female   DOB: 22-Sep-1941, 74 y.o.   MRN: [id number] F/u on Ut cancer- LV- 04/2014 Pt was seen by Dr. Clarene Essex- 11/2014- normal exam Pt c/o of something on her bladder  Chief complaint:  1.  Follow-up on endometrial cancer.  Patient presents for 6 month interval follow-up for endometrial cancer; last seen by Dr. Cindie Laroche at [facility].  Patient reports normal bowel and bladder function.  She does not have any complaints of vaginal dryness, discharge, or bleeding.  She does report prolapse of the vagina and thought he felt a wartlike lesion on her bladder.  Past GYN/ONC history summary: 05/14/2011.  Initial diagnosis of endometrial cancer by an initial biopsy. 05/25/2011  Robotic hysterectomy, BSO with pelvic and aortic lymph node dissection.  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 06/2011 through 10/2011.  Chemotherapy with Taxol/carboplatin 6 08/2011 Brachytherapy of vaginal cuff  Past Medical History  Diagnosis Date  . Insomnia   . Osteoporosis   . Hypothyroidism   . Arthritis   . Squamous cell carcinoma     RT ARM  . Breast cancer   . Uterine cancer    Past Surgical History  Procedure Laterality Date  . Mastectomy      LEFT WITH AXILLARY NODE DISSECTION, RADICAL RIGHT PROPHYLATIC W/O AXILLARY DISSECTION  . Placement of breast implants      SILICONE RECONSTRUCTION  . Abdominal hysterectomy  05/2011    BSO- RADICAL AND CHEMOTHERAPY COMPLETE    Review of Systems  Constitutional: Positive for weight loss. Negative for fever, chills and malaise/fatigue.  HENT: Negative.   Respiratory: Negative.   Cardiovascular: Negative.   Gastrointestinal: Negative.   Genitourinary: Negative.        Patient felt wartlike lesion on bladder  Musculoskeletal: Negative.   Skin: Negative.    OBJECTIVE: BP 129/77  mmHg  Pulse 109  Ht 5\' 5"  (1.651 m)  Wt 132 lb 14.4 oz (60.283 kg)  BMI 22.12 kg/m2 Pleasant white female in no acute distress; facies with light slight cachectic appearance Neck: Supple without thyromegaly or adenopathy. Lungs: Clear. Heart: Regular rate and rhythm without murmur. Breasts: No masses; implants present. Abdomen: Soft, nontender, without organomegaly. Pelvic exam:  External genitalia: Mild atrophy.  BUS: Urethral caruncle.  Vagina: First to second-degree cystocele; two 4 mm cystic nodules are noted at the vaginal apex, red in appearance, nonfriable (Biopsied)  Cervix: Surgically absent  Uterus: Surgically absent  Bimanual exam: No palpable masses or tenderness.  Rectovaginal: Normal.  External exam; normal sphincter tone,; no rectal masses  Extremities: Without clubbing, cyanosis or edema Skin: Without rash  IMPRESSION: 1.  History of grade 3 endometrioid adenocarcinoma, status post robotic hysterectomy, BSO with lymph node dissection in 2012. 2.  Status post chemotherapy 2012. 3.  Status post radiotherapy of vagina, 2012 4.  TWO new vaginal nodules, 4 mm diameter; biopsied  PLAN: 1.  Vaginal biopsy 2. 2.  Patient will be notified by phone of results. 3.  Patient is to follow-up with Dr. Clarene Essex as scheduled in 6 months and with me in 1 year.  Brayton Mars, MD

## 2015-07-29 LAB — PATHOLOGY

## 2016-05-02 ENCOUNTER — Telehealth: Payer: Self-pay | Admitting: *Deleted

## 2016-05-02 NOTE — Telephone Encounter (Signed)
Patient coming for appointment for labs/MD tomorrow.  Wants to know if we can add thyroid panel?

## 2016-05-03 ENCOUNTER — Inpatient Hospital Stay (HOSPITAL_BASED_OUTPATIENT_CLINIC_OR_DEPARTMENT_OTHER): Payer: Medicare Other | Admitting: Oncology

## 2016-05-03 ENCOUNTER — Inpatient Hospital Stay: Payer: Medicare Other | Attending: Oncology

## 2016-05-03 VITALS — BP 127/85 | HR 73 | Temp 97.2°F | Resp 18 | Wt 144.2 lb

## 2016-05-03 DIAGNOSIS — Z8589 Personal history of malignant neoplasm of other organs and systems: Secondary | ICD-10-CM | POA: Insufficient documentation

## 2016-05-03 DIAGNOSIS — C541 Malignant neoplasm of endometrium: Secondary | ICD-10-CM

## 2016-05-03 DIAGNOSIS — Z9221 Personal history of antineoplastic chemotherapy: Secondary | ICD-10-CM | POA: Diagnosis not present

## 2016-05-03 DIAGNOSIS — Z9013 Acquired absence of bilateral breasts and nipples: Secondary | ICD-10-CM | POA: Diagnosis not present

## 2016-05-03 DIAGNOSIS — Z923 Personal history of irradiation: Secondary | ICD-10-CM | POA: Insufficient documentation

## 2016-05-03 DIAGNOSIS — Z79899 Other long term (current) drug therapy: Secondary | ICD-10-CM | POA: Insufficient documentation

## 2016-05-03 DIAGNOSIS — E039 Hypothyroidism, unspecified: Secondary | ICD-10-CM | POA: Insufficient documentation

## 2016-05-03 DIAGNOSIS — Z853 Personal history of malignant neoplasm of breast: Secondary | ICD-10-CM | POA: Insufficient documentation

## 2016-05-03 LAB — CBC WITH DIFFERENTIAL/PLATELET
BASOS ABS: 0.1 10*3/uL (ref 0–0.1)
BASOS PCT: 1 %
EOS ABS: 0.1 10*3/uL (ref 0–0.7)
Eosinophils Relative: 1 %
HCT: 41.4 % (ref 35.0–47.0)
Hemoglobin: 14.2 g/dL (ref 12.0–16.0)
Lymphocytes Relative: 37 %
Lymphs Abs: 2.3 10*3/uL (ref 1.0–3.6)
MCH: 31.8 pg (ref 26.0–34.0)
MCHC: 34.3 g/dL (ref 32.0–36.0)
MCV: 92.8 fL (ref 80.0–100.0)
MONO ABS: 0.6 10*3/uL (ref 0.2–0.9)
MONOS PCT: 10 %
NEUTROS ABS: 3.2 10*3/uL (ref 1.4–6.5)
Neutrophils Relative %: 51 %
PLATELETS: 250 10*3/uL (ref 150–440)
RBC: 4.46 MIL/uL (ref 3.80–5.20)
RDW: 13.3 % (ref 11.5–14.5)
WBC: 6.2 10*3/uL (ref 3.6–11.0)

## 2016-05-03 LAB — COMPREHENSIVE METABOLIC PANEL
ALT: 16 U/L (ref 14–54)
AST: 25 U/L (ref 15–41)
Albumin: 4.5 g/dL (ref 3.5–5.0)
Alkaline Phosphatase: 76 U/L (ref 38–126)
Anion gap: 9 (ref 5–15)
BUN: 16 mg/dL (ref 6–20)
CHLORIDE: 106 mmol/L (ref 101–111)
CO2: 24 mmol/L (ref 22–32)
CREATININE: 0.82 mg/dL (ref 0.44–1.00)
Calcium: 9.5 mg/dL (ref 8.9–10.3)
GFR calc Af Amer: >60 mL/min (ref >60)
Glucose, Bld: 112 mg/dL — ABNORMAL HIGH (ref 65–99)
POTASSIUM: 4.1 mmol/L (ref 3.5–5.1)
SODIUM: 139 mmol/L (ref 135–145)
Total Bilirubin: 1.7 mg/dL — ABNORMAL HIGH (ref 0.3–1.2)
Total Protein: 7.3 g/dL (ref 6.5–8.1)

## 2016-05-03 LAB — TSH: TSH: 3.607 u[IU]/mL (ref 0.350–4.500)

## 2016-05-03 NOTE — Telephone Encounter (Signed)
T4 and TSH have already been ordered.

## 2016-05-04 ENCOUNTER — Encounter: Payer: Self-pay | Admitting: *Deleted

## 2016-05-04 LAB — T4: T4 TOTAL: 10.6 ug/dL (ref 4.5–12.0)

## 2016-05-04 LAB — CA 125: CA 125: 14.7 U/mL (ref 0.0–38.1)

## 2016-05-04 NOTE — Progress Notes (Signed)
Labs from 05/03/16 mailed to patient per patient request.

## 2016-05-06 ENCOUNTER — Encounter: Payer: Self-pay | Admitting: Oncology

## 2016-05-06 NOTE — Progress Notes (Signed)
T4 and TSH have already been ordered.  [place] @ [facility] Telephone:(336) [phone]  Fax:(336) [place]: 03-12-1941  MR#: [id number]  PZW#:[id number]  Patient Care Team: [name], MD as PCP - General (Family Medicine)  CHIEF COMPLAINT:  Chief Complaint  Patient presents with  . Endometrial Cancer    Oncology History   Carcinoma of uterusStatus post hysterectomy and bilateral salpingo-oophorectomy, pelvic and aortic lymph node dissection.  (June 15 of 2012) surgery was done at [facility] by Dr. Clarene Essex.  STARTED ON CHEMOTHERAPY WITH CARBOPLATINUM AND TAXOL.  IN [place] OF 2012 patient finished carboplatinum and Taxol in November of 2012   Also had a brachii therapy site treated vaginal cuff Tumor dose 2350 cGy in 5 fractions Start date 08/31/11 Complete date October 50,012 Using iridium 192 high-dose rate remote afterloading BrachyVision treatment planning  2.  History of carcinoma of breast in 1987, and in 1988, bilateral mastectomy, followed by 5 years of anti-hormonal treatment with tamoxifen.     Cancer of endometrium ([place])   05/14/2011 Initial Diagnosis Cancer of endometrium    No flowsheet data found.  INTERVAL HISTORY: .  75 year old lady with history of carcinoma of the endometrium previous history of carcinoma of breast.  Came today for the follow-up.  No chills.  No fever.  No nausea.  No vomiting.  No diarrhea.  Appetite is improved.  Patient speaks followed by GYN oncologist on regular basis.  Patient is here for ongoing evaluation and treatment consideration.  Patient is getting regular evaluation by GYN oncologist.  No abdominal pain no nausea no vomiting or diarrhea. Tingling or numbness. REVIEW OF SYSTEMS:   GENERAL:  Feels good.  Active.  No fevers, sweats or weight loss. PERFORMANCE STATUS (ECOG):  0 HEENT:  No visual changes, runny nose, sore throat, mouth sores or tenderness. Lungs: No shortness of breath or cough.  No  hemoptysis. Cardiac:  No chest pain, palpitations, orthopnea, or PND. GI:  No nausea, vomiting, diarrhea, constipation, melena or hematochezia. GU:  No urgency, frequency, dysuria, or hematuria. Musculoskeletal:  No back pain.  No joint pain.  No muscle tenderness. Extremities:  No pain or swelling. Skin:  No rashes or skin changes. Neuro:  No headache, numbness or weakness, balance or coordination issues. Endocrine:  No diabetes, thyroid issues, hot flashes or night sweats. Psych:  No mood changes, depression or anxiety. Pain:  No focal pain. Review of systems:  All other systems reviewed and found to be negative. As per HPI. Otherwise, a complete review of systems is negatve.  Significant History/PMH:   Endometrial Cancer:    Breast Cancer:    Hypothyroidism:    Mastectomy:   Preventive Screening:  Has patient had any of the following test? Colonscopy  Mammography (1)   Last Colonoscopy: 2010(1)   Last Mammography: bilateral mastectomy in 1988(1)   Smoking History: Smoking History Never Smoked.(1)  PFSH: Comments: No family history of colorectal cancer, breast cancer, or ovarian cancer.  Comments: Does not smoke, does not drink.  Patient works in [employer] Past Medical and Surgical History: Bilateral mastectomy for carcinoma of breast  Hyperthyroidism status post radioactive iodine ablation treatment   ADVANCED DIRECTIVES: This and does not have any advance care directive.  Information was given to her  HEALTH MAINTENANCE: Social History  Substance Use Topics  . Smoking status: Never Smoker   . Smokeless tobacco: None  . Alcohol Use: No  Allergies  Allergen Reactions  . Sulfa Antibiotics Nausea Only    Current Outpatient Prescriptions  Medication Sig Dispense Refill  . calcium carbonate (OS-CAL - DOSED IN MG OF ELEMENTAL CALCIUM) 1250 (500 CA) MG tablet Take by mouth.    . Cholecalciferol (VITAMIN D3) 5000 UNITS TABS Take by mouth.    .  levothyroxine (SYNTHROID) 50 MCG tablet     . levothyroxine (SYNTHROID) 88 MCG tablet Take by mouth.    . meloxicam (MOBIC) 15 MG tablet Take 15 mg by mouth.    . Multiple Vitamins-Minerals (CENTRUM SILVER PO) Take by mouth.    . SUPER B COMPLEX & C TABS Take 150 mg by mouth.    . zolpidem (AMBIEN) 10 MG tablet      No current facility-administered medications for this visit.    OBJECTIVE:  Filed Vitals:   05/03/16 1614  BP: 127/85  Pulse: 73  Temp: 97.2 F (36.2 C)  Resp: 18     Body mass index is 23.99 kg/(m^2).    ECOG FS:0 - Asymptomatic  PHYSICAL EXAM: GENERAL:  Well developed, well nourished, sitting comfortably in the exam room in no acute distress. MENTAL STATUS:  Alert and oriented to person, place and time. HEAD: Normocephalic, atraumatic, face symmetric, no Cushingoid features. EYES.  Pupils equal round and reactive to light and accomodation.  No conjunctivitis or scleral icterus. ENT:  Oropharynx clear without lesion.  Tongue normal. Mucous membranes moist.  RESPIRATORY:  Clear to auscultation without rales, wheezes or rhonchi. CARDIOVASCULAR:  Regular rate and rhythm without murmur, rub or gallop. BREAST:  Bilateral mastectomy.  No evidence of recurrent disease ABDOMEN:  Soft, non-tender, with active bowel sounds, and no hepatosplenomegaly.  No masses. BACK:  No CVA tenderness.  No tenderness on percussion of the back or rib cage. SKIN:  No rashes, ulcers or lesions. EXTREMITIES: No edema, no skin discoloration or tenderness.  No palpable cords. LYMPH NODES: No palpable cervical, supraclavicular, axillary or inguinal adenopathy  NEUROLOGICAL: Unremarkable. PSYCH:  Appropriate.   LAB RESULTS:  Appointment on 05/03/2016  Component Date Value Ref Range Status  . WBC 05/03/2016 6.2  3.6 - 11.0 K/uL Final  . RBC 05/03/2016 4.46  3.80 - 5.20 MIL/uL Final  . Hemoglobin 05/03/2016 14.2  12.0 - 16.0 g/dL Final  . HCT 05/03/2016 41.4  35.0 - 47.0 % Final  . MCV  05/03/2016 92.8  80.0 - 100.0 fL Final  . MCH 05/03/2016 31.8  26.0 - 34.0 pg Final  . MCHC 05/03/2016 34.3  32.0 - 36.0 g/dL Final  . RDW 05/03/2016 13.3  11.5 - 14.5 % Final  . Platelets 05/03/2016 250  150 - 440 K/uL Final  . Neutrophils Relative % 05/03/2016 51   Final  . Neutro Abs 05/03/2016 3.2  1.4 - 6.5 K/uL Final  . Lymphocytes Relative 05/03/2016 37   Final  . Lymphs Abs 05/03/2016 2.3  1.0 - 3.6 K/uL Final  . Monocytes Relative 05/03/2016 10   Final  . Monocytes Absolute 05/03/2016 0.6  0.2 - 0.9 K/uL Final  . Eosinophils Relative 05/03/2016 1   Final  . Eosinophils Absolute 05/03/2016 0.1  0 - 0.7 K/uL Final  . Basophils Relative 05/03/2016 1   Final  . Basophils Absolute 05/03/2016 0.1  0 - 0.1 K/uL Final  . Sodium 05/03/2016 139  135 - 145 mmol/L Final  . Potassium 05/03/2016 4.1  3.5 - 5.1 mmol/L Final  . Chloride 05/03/2016 106  101 - 111 mmol/L Final  .  CO2 05/03/2016 24  22 - 32 mmol/L Final  . Glucose, Bld 05/03/2016 112* 65 - 99 mg/dL Final  . BUN 05/03/2016 16  6 - 20 mg/dL Final  . Creatinine, Ser 05/03/2016 0.82  0.44 - 1.00 mg/dL Final  . Calcium 05/03/2016 9.5  8.9 - 10.3 mg/dL Final  . Total Protein 05/03/2016 7.3  6.5 - 8.1 g/dL Final  . Albumin 05/03/2016 4.5  3.5 - 5.0 g/dL Final  . AST 05/03/2016 25  15 - 41 U/L Final  . ALT 05/03/2016 16  14 - 54 U/L Final  . Alkaline Phosphatase 05/03/2016 76  38 - 126 U/L Final  . Total Bilirubin 05/03/2016 1.7* 0.3 - 1.2 mg/dL Final  . GFR calc non Af Amer 05/03/2016 >60  >60 mL/min Final  . GFR calc Af Amer 05/03/2016 >60  >60 mL/min Final   Comment: (NOTE) The eGFR has been calculated using the CKD EPI equation. This calculation has not been validated in all clinical situations. eGFR's persistently <60 mL/min signify possible Chronic Kidney Disease.   . Anion gap 05/03/2016 9  5 - 15 Final  . CA 125 05/03/2016 14.7  0.0 - 38.1 U/mL Corrected   Comment: (NOTE) [facility] ECLIA methodology Performed At: [facility] [place], [place] [id number] Lindon Romp MD [id number]   . T4, Total 05/03/2016 10.6  4.5 - 12.0 ug/dL Final   Comment: (NOTE) Performed At: [facility] [place], [place] [id number] Lindon Romp MD [id number]   . TSH 05/03/2016 3.607  0.350 - 4.500 uIU/mL Final      STUDIES: No results found.  ASSESSMENT: 1.  Carcinoma of endometrium status postresection chemotherapy and brachii therapy there is no evidence of recurrent disease.  Tumor markers are normal. 2.  Previous history of bilateral carcinoma of breast no evidence of recurrent disease status post bilateral mastectomy CA-125 is within acceptable range.  On clinical grounds there is no evidence of recurrent disease.  Patient is getting regular follow-up by GYN oncologists at [facility]. Yearly checkup for endometrial cancer and bilateral carcinoma of breast recommended  MEDICAL DECISION MAKING:  Continue yearly follow-up.  Patient was advised to continue follow-up with primary care physician for routine health maintenance Bilirubin is slightly elevated but unchanged from the previous multiple examination Patient has a Gilbert's syndrome  Patient expressed understanding and was in agreement with this plan. She also understands that She can call clinic at any time with any questions, concerns, or complaints.    No matching staging information was found for the patient.  Forest Gleason, MD   05/06/2016 7:33 AM

## 2016-05-31 ENCOUNTER — Ambulatory Visit: Payer: Medicare Other | Admitting: Oncology

## 2016-05-31 ENCOUNTER — Other Ambulatory Visit: Payer: Medicare Other

## 2016-06-27 ENCOUNTER — Ambulatory Visit (INDEPENDENT_AMBULATORY_CARE_PROVIDER_SITE_OTHER): Payer: Medicare Other | Admitting: Obstetrics and Gynecology

## 2016-06-27 ENCOUNTER — Encounter: Payer: Self-pay | Admitting: Obstetrics and Gynecology

## 2016-06-27 VITALS — BP 133/80 | HR 91 | Ht 65.0 in | Wt 144.2 lb

## 2016-06-27 DIAGNOSIS — N952 Postmenopausal atrophic vaginitis: Secondary | ICD-10-CM | POA: Diagnosis not present

## 2016-06-27 DIAGNOSIS — C541 Malignant neoplasm of endometrium: Secondary | ICD-10-CM

## 2016-06-27 DIAGNOSIS — N362 Urethral caruncle: Secondary | ICD-10-CM

## 2016-06-27 NOTE — Patient Instructions (Signed)
1. Return in 1 year for follow-up on endometrial cancer

## 2016-06-27 NOTE — Progress Notes (Signed)
Last GYN oncology visit-6 months ago GYN oncologist-Dr. Clarene Essex  Chief complaint:  1. Follow-up on endometrial cancer.  Patient presents for 6 month interval follow-up for endometrial cancer; last seen by Dr. Cindie Laroche at [facility] 6 months ago-normal examination at time  Patient reports normal bowel and bladder function.Patient notes occasional constipation when she eats cheese. She does not have any complaints of vaginal dryness, discharge, or bleeding.  Past GYN/ONC history summary: 05/14/2011. Initial diagnosis of endometrial cancer by an initial biopsy. 05/25/2011 Robotic hysterectomy, BSO with pelvic and aortic lymph node dissection. 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 06/2011 through 10/2011. Chemotherapy with Taxol/carboplatin 6 08/2011 Brachytherapy of vaginal cuff  Past Medical History  Diagnosis Date  . Insomnia   . Osteoporosis   . Hypothyroidism   . Arthritis   . 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 Breast cancer ([place])   . Uterine cancer [facility])    Past Surgical History  Procedure Laterality Date  . Mastectomy      LEFT WITH AXILLARY NODE DISSECTION, RADICAL RIGHT PROPHYLATIC W/O AXILLARY DISSECTION  . Placement of breast implants      SILICONE RECONSTRUCTION  . Abdominal hysterectomy  05/2011    BSO- RADICAL AND CHEMOTHERAPY COMPLETE     Review of systems: Per history of present illness  OBJECTIVE: BP 133/80 mmHg  Pulse 91  Ht 5\' 5"  (1.651 m)  Wt 144 lb 3.2 oz (65.409 kg)  BMI 24.00 kg/m2 Pleasant well-appearing female in no acute distress. Alert and oriented. Neck: Supple without thyromegaly or adenopathy Abdomen: Soft, nontender, without organomegaly Pelvic exam: External genitalia-atrophic changes with agglutination of labia minora noted. Faint changes of possible lichen  sclerosus noted around posterior fourchette (patient asymptomatic) BUS-urethral caruncle Vagina-severe atrophy with several 5 mm areas of hyperemia on the vaginal cuff (previously biopsied) Bimanual-no masses or tenderness Rectovaginal-normal sphincter tone; and granular stool in the rectal vault; no masses  ASSESSMENT: 1. History of grade 3 endometrioid adenocarcinoma, status post robotic hysterectomy, BSO with lymph node dissection in 2012 2. Status post chemotherapy 2012 3. Status post radiotherapy of vagina, 2012 4. No evidence of disease 5. Vulvovaginal atrophy, asymptomatic  PLAN: 1. Return to see Dr. Cindie Laroche in 6 months, GYN oncology 2. Return in 1 year for routine yearly follow-up  A total of 15 minutes were spent face-to-face with the patient during this encounter and over half of that time dealt with counseling and coordination of care.  Brayton Mars, MD  Note: This dictation was prepared with Dragon dictation along with smaller phrase technology. Any transcriptional errors that result from this process are unintentional.

## 2017-03-19 ENCOUNTER — Telehealth: Payer: Self-pay | Admitting: Obstetrics and Gynecology

## 2017-03-19 NOTE — Telephone Encounter (Signed)
Pt aware she will need to see mad 06/2017 for 1 year f/u endometrial ca. Appt made for 07/02/2017. She states she has been discharged from the ca center. I advised her she did not need a pap but she needs to see mad. MAD will discuss f/u at that time.

## 2017-03-19 NOTE — Telephone Encounter (Signed)
Patient called wondering if she still needed to have pap smears done. Please advise.

## 2017-06-21 ENCOUNTER — Emergency Department
Admission: EM | Admit: 2017-06-21 | Discharge: 2017-06-21 | Disposition: A | Discharge status: Home / Self Care | Payer: Medicare Other | Attending: Emergency Medicine | Admitting: Emergency Medicine

## 2017-06-21 ENCOUNTER — Encounter: Payer: Self-pay | Admitting: Emergency Medicine

## 2017-06-21 ENCOUNTER — Emergency Department: Payer: Medicare Other

## 2017-06-21 DIAGNOSIS — Z79899 Other long term (current) drug therapy: Secondary | ICD-10-CM | POA: Insufficient documentation

## 2017-06-21 DIAGNOSIS — I493 Ventricular premature depolarization: Secondary | ICD-10-CM | POA: Diagnosis not present

## 2017-06-21 DIAGNOSIS — E039 Hypothyroidism, unspecified: Secondary | ICD-10-CM | POA: Insufficient documentation

## 2017-06-21 DIAGNOSIS — R002 Palpitations: Secondary | ICD-10-CM

## 2017-06-21 LAB — BASIC METABOLIC PANEL
Anion gap: 10 (ref 5–15)
BUN: 11 mg/dL (ref 6–20)
CALCIUM: 9.2 mg/dL (ref 8.9–10.3)
CO2: 24 mmol/L (ref 22–32)
Chloride: 106 mmol/L (ref 101–111)
Creatinine, Ser: 0.89 mg/dL (ref 0.44–1.00)
GFR calc Af Amer: >60 mL/min (ref >60)
GLUCOSE: 109 mg/dL — AB (ref 65–99)
Potassium: 3.6 mmol/L (ref 3.5–5.1)
Sodium: 140 mmol/L (ref 135–145)

## 2017-06-21 LAB — CBC
HEMATOCRIT: 40.4 % (ref 35.0–47.0)
HEMOGLOBIN: 13.7 g/dL (ref 12.0–16.0)
MCH: 31.4 pg (ref 26.0–34.0)
MCHC: 34 g/dL (ref 32.0–36.0)
MCV: 92.5 fL (ref 80.0–100.0)
Platelets: 259 10*3/uL (ref 150–440)
RBC: 4.36 MIL/uL (ref 3.80–5.20)
RDW: 13.2 % (ref 11.5–14.5)
WBC: 5.5 10*3/uL (ref 3.6–11.0)

## 2017-06-21 LAB — SALICYLATE LEVEL

## 2017-06-21 LAB — T4, FREE: Free T4: 1.31 ng/dL — ABNORMAL HIGH (ref 0.61–1.12)

## 2017-06-21 LAB — TROPONIN I

## 2017-06-21 LAB — TSH: TSH: 0.418 u[IU]/mL (ref 0.350–4.500)

## 2017-06-21 NOTE — ED Provider Notes (Signed)
[facility] Emergency Department Provider Note   ____________________________________________   I have reviewed the triage vital signs and the nursing notes.   HISTORY  Chief Complaint Palpitations   History limited by: Not Limited   HPI Cynthia [name] is a 76 y.o. female who presents to the emergency department today because of concerns for palpitations and nausea. The patient states the symptoms started when she woke up today. The patient said it was a sensation of her heart skipping beats. This was accompanied by some nausea. By the time I examined the symptoms had improved. She states that she has had this happen to her before in the past and has never been evaluated for it. The patient states she has been under a lot of stress recently. Denies any recent illness.   Past Medical History:  Diagnosis Date  . Arthritis   . Breast cancer ([place])   . Hypothyroidism   . Insomnia   . Osteoporosis   . Squamous cell carcinoma    RT ARM  . Uterine cancer [facility])     Patient Active Problem List   Diagnosis Date Noted  . Vaginal lesion 07/27/2015  . Adult hypothyroidism 05/10/2014  . Follow-up examination, following other surgery 11/12/2011  . Encounter for follow-up examination after completed treatment for conditions other than malignant neoplasm 11/12/2011  . Cancer of endometrium ([place]) 05/14/2011  . Malignant neoplasm of endometrium ([place]) 05/14/2011    Past Surgical History:  Procedure Laterality Date  . ABDOMINAL HYSTERECTOMY  05/2011   BSO- RADICAL AND CHEMOTHERAPY COMPLETE   . MASTECTOMY     LEFT WITH AXILLARY NODE DISSECTION, RADICAL RIGHT PROPHYLATIC W/O AXILLARY DISSECTION  . PLACEMENT OF BREAST IMPLANTS     SILICONE RECONSTRUCTION    Prior to Admission medications   Medication Sig Start Date End Date Taking? Authorizing Provider  ascorbic acid (VITAMIN C) 1000 MG tablet Take 1,000 mg by mouth daily.   Yes [provider]  calcium  carbonate (OS-CAL - DOSED IN MG OF ELEMENTAL CALCIUM) 1250 (500 CA) MG tablet Take by mouth.   Yes [provider]  cholecalciferol (VITAMIN D) 1000 units tablet Take 1,000 Units by mouth daily.   Yes [provider]  levothyroxine (SYNTHROID) 50 MCG tablet Take 50 mcg by mouth every other day.    Yes [provider]  levothyroxine (SYNTHROID, LEVOTHROID) 75 MCG tablet Take 75 mcg by mouth every other day.   Yes [provider]  Multiple Vitamin (MULTIVITAMIN WITH MINERALS) TABS tablet Take 1 tablet by mouth daily.   Yes [provider]  zolpidem (AMBIEN) 10 MG tablet Take 10 mg by mouth at bedtime as needed for sleep.    Yes [provider]    Allergies Sulfa antibiotics  Family History  Problem Relation Age of Onset  . Osteoporosis Mother   . Hypertension Father   . Diabetes Neg Hx   . Cancer Neg Hx     Social History Social History  Substance Use Topics  . Smoking status: Never Smoker  . Smokeless tobacco: Never Used  . Alcohol use No    Review of Systems Constitutional: No fever/chills Eyes: No visual changes. ENT: No sore throat. Cardiovascular: Denies chest pain. Positive for palpitations. Respiratory: Denies shortness of breath. Gastrointestinal: No abdominal pain.  Positive for nausea, no vomiting.  No diarrhea.   Genitourinary: Negative for dysuria. Musculoskeletal: Negative for back pain. Skin: Negative for rash. Neurological: Negative for headaches, focal weakness or numbness.  ____________________________________________  PHYSICAL EXAM:  VITAL SIGNS: ED Triage Vitals [06/21/17 0609]  Enc Vitals Group     BP 129/85     Pulse Rate 71     Resp 16     Temp 97.7 F (36.5 C)     Temp Source Oral     SpO2 97 %   Constitutional: Alert and oriented. Well appearing and in no distress. Eyes: Conjunctivae are normal.  ENT   Head: Normocephalic and atraumatic.   Nose: No congestion/rhinnorhea.    Mouth/Throat: Mucous membranes are moist.   Neck: No stridor. Hematological/Lymphatic/Immunilogical: No cervical lymphadenopathy. Cardiovascular: Normal rate, regular rhythm.  No murmurs, rubs, or gallops.  Respiratory: Normal respiratory effort without tachypnea nor retractions. Breath sounds are clear and equal bilaterally. No wheezes/rales/rhonchi. Gastrointestinal: Soft and non tender. No rebound. No guarding.  Genitourinary: Deferred Musculoskeletal: Normal range of motion in all extremities. No lower extremity edema. Neurologic:  Normal speech and language. No gross focal neurologic deficits are appreciated.  Skin:  Skin is warm, dry and intact. No rash noted. Psychiatric: Mood and affect are normal. Speech and behavior are normal. Patient exhibits appropriate insight and judgment.  ____________________________________________    LABS (pertinent positives/negatives)  Labs Reviewed  BASIC METABOLIC PANEL - Abnormal; Notable for the following:       Result Value   Glucose, Bld 109 (*)    All other components within normal limits  T4, FREE - Abnormal; Notable for the following:    Free T4 1.31 (*)    All other components within normal limits  CBC  TROPONIN I  SALICYLATE LEVEL  TSH     ____________________________________________   EKG  I, Nance Pear, attending physician, personally viewed and interpreted this EKG  EKG Time: 0528 Rate: 78 Rhythm: sinus rhythm with arrhythmia and PVC Axis: normal Intervals: qtc 401 QRS: narrow ST changes: no st elevation Impression: abnormal ekg   ____________________________________________    RADIOLOGY  CXR IMPRESSION: No active cardiopulmonary disease.   ____________________________________________   PROCEDURES  Procedures  ____________________________________________   INITIAL IMPRESSION / ASSESSMENT AND PLAN / ED COURSE  Pertinent labs & imaging results that were available during my care of the  patient were reviewed by me and considered in my medical decision making (see chart for details).  Patient presented to the emergency department today because of concerns for palpitations. EKG did show a PVC. I ankle likely this is what the patient is experiencing.  T4 was minimally elevated. Discussed this with patient. Discussed ports following up with her primary care.  ____________________________________________   FINAL CLINICAL IMPRESSION(S) / ED DIAGNOSES  Final diagnoses:  Palpitations  PVC (premature ventricular contraction)     Note: This dictation was prepared with Dragon dictation. Any transcriptional errors that result from this process are unintentional Nance Pear, MD 06/21/17 [phone]

## 2017-06-21 NOTE — ED Notes (Signed)
Patient transported to X-ray 

## 2017-06-21 NOTE — ED Triage Notes (Signed)
Pt to triage in wheelchair. Pt woke this AM with feeling of palpitations, dizziness and nausea. Pt has hx of unknown arrhythmia. Pt denies SOB and chest pain at this time.

## 2017-06-21 NOTE — Discharge Instructions (Signed)
Please seek medical attention for any high fevers, chest pain, shortness of breath, change in behavior, persistent vomiting, bloody stool or any other new or concerning symptoms.  

## 2017-06-21 NOTE — ED Notes (Signed)
Pt states she has been on synthroid "for many years."

## 2017-07-02 ENCOUNTER — Encounter: Payer: Medicare Other | Admitting: Obstetrics and Gynecology

## 2017-11-27 ENCOUNTER — Ambulatory Visit: Payer: Medicare Other | Admitting: Obstetrics and Gynecology

## 2017-11-27 ENCOUNTER — Encounter: Payer: Self-pay | Admitting: Obstetrics and Gynecology

## 2017-11-27 VITALS — BP 134/79 | HR 108 | Ht 65.0 in | Wt 134.2 lb

## 2017-11-27 DIAGNOSIS — N8111 Cystocele, midline: Secondary | ICD-10-CM | POA: Diagnosis not present

## 2017-11-27 DIAGNOSIS — N952 Postmenopausal atrophic vaginitis: Secondary | ICD-10-CM

## 2017-11-27 DIAGNOSIS — C541 Malignant neoplasm of endometrium: Secondary | ICD-10-CM

## 2017-11-27 NOTE — Patient Instructions (Signed)
1.  Pap smear is done today 2.  Continue with healthy eating and increased exercise. 3.  Continue with calcium with vitamin D supplementation daily 4.  Return in 1 year for follow-up 5.  Continue seeing Dr. Kary [name] for primary care  Health Maintenance for Postmenopausal Women Menopause is a normal process in which your reproductive ability comes to an end. This process happens gradually over a span of months to years, usually between the ages of 40 and 31. Menopause is complete when you have missed 12 consecutive menstrual periods. It is important to talk with your health care provider about some of the most common conditions that affect postmenopausal women, such as heart disease, cancer, and bone loss (osteoporosis). Adopting a healthy lifestyle and getting preventive care can help to promote your health and wellness. Those actions can also lower your chances of developing some of these common conditions. What should I know about menopause? During menopause, you may experience a number of symptoms, such as:  Moderate-to-severe hot flashes.  Night sweats.  Decrease in sex drive.  Mood swings.  Headaches.  Tiredness.  Irritability.  Memory problems.  Insomnia.  Choosing to treat or not to treat menopausal changes is an individual decision that you make with your health care provider. What should I know about hormone replacement therapy and supplements? Hormone therapy products are effective for treating symptoms that are associated with menopause, such as hot flashes and night sweats. Hormone replacement carries certain risks, especially as you become older. If you are thinking about using estrogen or estrogen with progestin treatments, discuss the benefits and risks with your health care provider. What should I know about heart disease and stroke? Heart disease, heart attack, and stroke become more likely as you age. This may be due, in part, to the hormonal changes that your body  experiences during menopause. These can affect how your body processes dietary fats, triglycerides, and cholesterol. Heart attack and stroke are both medical emergencies. There are many things that you can do to help prevent heart disease and stroke:  Have your blood pressure checked at least every 1-2 years. High blood pressure causes heart disease and increases the risk of stroke.  If you are 84-65 years old, ask your health care provider if you should take aspirin to prevent a heart attack or a stroke.  Do not use any tobacco products, including cigarettes, chewing tobacco, or electronic cigarettes. If you need help quitting, ask your health care provider.  It is important to eat a healthy diet and maintain a healthy weight. ? Be sure to include plenty of vegetables, fruits, low-fat dairy products, and lean protein. ? Avoid eating foods that are high in solid fats, added sugars, or salt (sodium).  Get regular exercise. This is one of the most important things that you can do for your health. ? Try to exercise for at least 150 minutes each week. The type of exercise that you do should increase your heart rate and make you sweat. This is known as moderate-intensity exercise. ? Try to do strengthening exercises at least twice each week. Do these in addition to the moderate-intensity exercise.  Know your numbers.Ask your health care provider to check your cholesterol and your blood glucose. Continue to have your blood tested as directed by your health care provider.  What should I know about cancer screening? There are several types of cancer. Take the following steps to reduce your risk and to catch any cancer development as early as  possible. Breast Cancer  Practice breast self-awareness. ? This means understanding how your breasts normally appear and feel. ? It also means doing regular breast self-exams. Let your health care provider know about any changes, no matter how small.  If you  are 47 or older, have a clinician do a breast exam (clinical breast exam or CBE) every year. Depending on your age, family history, and medical history, it may be recommended that you also have a yearly breast X-ray (mammogram).  If you have a family history of breast cancer, talk with your health care provider about genetic screening.  If you are at high risk for breast cancer, talk with your health care provider about having an MRI and a mammogram every year.  Breast cancer (BRCA) gene test is recommended for women who have family members with BRCA-related cancers. Results of the assessment will determine the need for genetic counseling and BRCA1 and for BRCA2 testing. BRCA-related cancers include these types: ? Breast. This occurs in males or females. ? Ovarian. ? Tubal. This may also be called fallopian tube cancer. ? Cancer of the abdominal or pelvic lining (peritoneal cancer). ? Prostate. ? Pancreatic.  Cervical, Uterine, and Ovarian Cancer Your health care provider may recommend that you be screened regularly for cancer of the pelvic organs. These include your ovaries, uterus, and vagina. This screening involves a pelvic exam, which includes checking for microscopic changes to the surface of your cervix (Pap test).  For women ages 21-65, health care providers may recommend a pelvic exam and a Pap test every three years. For women ages 47-65, they may recommend the Pap test and pelvic exam, combined with testing for human papilloma virus (HPV), every five years. Some types of HPV increase your risk of cervical cancer. Testing for HPV may also be done on women of any age who have unclear Pap test results.  Other health care providers may not recommend any screening for nonpregnant women who are considered low risk for pelvic cancer and have no symptoms. Ask your health care provider if a screening pelvic exam is right for you.  If you have had past treatment for cervical cancer or a  condition that could lead to cancer, you need Pap tests and screening for cancer for at least 20 years after your treatment. If Pap tests have been discontinued for you, your risk factors (such as having a new sexual partner) need to be reassessed to determine if you should start having screenings again. Some women have medical problems that increase the chance of getting cervical cancer. In these cases, your health care provider may recommend that you have screening and Pap tests more often.  If you have a family history of uterine cancer or ovarian cancer, talk with your health care provider about genetic screening.  If you have vaginal bleeding after reaching menopause, tell your health care provider.  There are currently no reliable tests available to screen for ovarian cancer.  Lung Cancer Lung cancer screening is recommended for adults 21-89 years old who are at high risk for lung cancer because of a history of smoking. A yearly low-dose CT scan of the lungs is recommended if you:  Currently smoke.  Have a history of at least 30 pack-years of smoking and you currently smoke or have quit within the past 15 years. A pack-year is smoking an average of one pack of cigarettes per day for one year.  Yearly screening should:  Continue until it has been 15 years  since you quit.  Stop if you develop a health problem that would prevent you from having lung cancer treatment.  Colorectal Cancer  This type of cancer can be detected and can often be prevented.  Routine colorectal cancer screening usually begins at age 28 and continues through age 48.  If you have risk factors for colon cancer, your health care provider may recommend that you be screened at an earlier age.  If you have a family history of colorectal cancer, talk with your health care provider about genetic screening.  Your health care provider may also recommend using home test kits to check for hidden blood in your stool.  A  small camera at the end of a tube can be used to examine your colon directly (sigmoidoscopy or colonoscopy). This is done to check for the earliest forms of colorectal cancer.  Direct examination of the colon should be repeated every 5-10 years until age 38. However, if early forms of precancerous polyps or small growths are found or if you have a family history or genetic risk for colorectal cancer, you may need to be screened more often.  Skin Cancer  Check your skin from head to toe regularly.  Monitor any moles. Be sure to tell your health care provider: ? About any new moles or changes in moles, especially if there is a change in a mole's shape or color. ? If you have a mole that is larger than the size of a pencil eraser.  If any of your family members has a history of skin cancer, especially at a young age, talk with your health care provider about genetic screening.  Always use sunscreen. Apply sunscreen liberally and repeatedly throughout the day.  Whenever you are outside, protect yourself by wearing long sleeves, pants, a wide-brimmed hat, and sunglasses.  What should I know about osteoporosis? Osteoporosis is a condition in which bone destruction happens more quickly than new bone creation. After menopause, you may be at an increased risk for osteoporosis. To help prevent osteoporosis or the bone fractures that can happen because of osteoporosis, the following is recommended:  If you are 3-53 years old, get at least 1,000 mg of calcium and at least 600 mg of vitamin D per day.  If you are older than age 40 but younger than age 26, get at least 1,200 mg of calcium and at least 600 mg of vitamin D per day.  If you are older than age 74, get at least 1,200 mg of calcium and at least 800 mg of vitamin D per day.  Smoking and excessive alcohol intake increase the risk of osteoporosis. Eat foods that are rich in calcium and vitamin D, and do weight-bearing exercises several times  each week as directed by your health care provider. What should I know about how menopause affects my mental health? Depression may occur at any age, but it is more common as you become older. Common symptoms of depression include:  Low or sad mood.  Changes in sleep patterns.  Changes in appetite or eating patterns.  Feeling an overall lack of motivation or enjoyment of activities that you previously enjoyed.  Frequent crying spells.  Talk with your health care provider if you think that you are experiencing depression. What should I know about immunizations? It is important that you get and maintain your immunizations. These include:  Tetanus, diphtheria, and pertussis (Tdap) booster vaccine.  Influenza every year before the flu season begins.  Pneumonia vaccine.  Shingles vaccine.  Your health care provider may also recommend other immunizations. This information is not intended to replace advice given to you by your health care provider. Make sure you discuss any questions you have with your health care provider. Document Released: 01/18/2006 Document Revised: 06/15/2016 Document Reviewed: 08/30/2015 Elsevier Interactive Patient Education  2018 [employer].

## 2017-11-27 NOTE — Progress Notes (Signed)
Last GYN oncology visit-18 months ago GYN oncologist-Dr. Clarene Essex (retired)  Chief complaint:  1. Follow-up on endometrial cancer patient has not seen Dr. Clarene Essex in approximately 18 months.  Patient has not seen Dr. Oliva Bustard since his retirement.  She understands that she should be seen for gynecologic evaluation on a yearly basis.  Patient presents for 1 year interval follow-up for endometrial cancer; last seen by Dr. Cindie Laroche at [facility] in 2017 normal examination at time  Patient reports normal bowel and bladder function.Patient notes occasional constipation when she eats cheese. She does not have any complaints of vaginal dryness, discharge, or bleeding.  Past GYN/ONC history summary: 05/14/2011. Initial diagnosis of endometrial cancer by an initial biopsy. 05/25/2011 Robotic hysterectomy, BSO with pelvic and aortic lymph node dissection. 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 06/2011 through 10/2011. Chemotherapy with Taxol/carboplatin 6 08/2011 Brachytherapy of vaginal cuff  Past Medical History:  Diagnosis Date  . Arthritis   . Breast cancer ([place])   . Hypothyroidism   . Insomnia   . Osteoporosis   . Squamous cell carcinoma    RT ARM  . Uterine cancer [facility])    Past Surgical History:  Procedure Laterality Date  . ABDOMINAL HYSTERECTOMY  05/2011   BSO- RADICAL AND CHEMOTHERAPY COMPLETE   . MASTECTOMY     LEFT WITH AXILLARY NODE DISSECTION, RADICAL RIGHT PROPHYLATIC W/O AXILLARY DISSECTION  . PLACEMENT OF BREAST IMPLANTS     SILICONE RECONSTRUCTION    Review of Systems  Constitutional: Negative.   Eyes: Negative.   Respiratory: Negative.   Cardiovascular: Negative.   Gastrointestinal: Negative.   Genitourinary: Negative.   Musculoskeletal: Negative.   Skin: Negative.   Neurological: Negative.    Endo/Heme/Allergies: Negative.   Psychiatric/Behavioral: Negative.      OBJECTIVE: BP 134/79   Pulse (!) 108   Ht 5\' 5"  (1.651 m)   Wt 134 lb 3.2 oz (60.9 kg)   BMI 22.33 kg/m  Pleasant well-appearing female in no acute distress. Alert and oriented. Neck: Supple without thyromegaly or adenopathy Breasts: Not examined (status post bilateral mastectomies years ago Abdomen: Soft, nontender, without organomegaly Pelvic exam: External genitalia-atrophic changes with agglutination of labia minora noted. BUS-urethral caruncle Vagina-severe atrophy with several 5 mm areas of hyperemia on the vaginal cuff (previously biopsied); first to second-degree cystocele; no rectocele Bimanual-no masses or tenderness; vaginal cuff is without mass or tenderness; scarring at cuff is noted-unchanged Rectovaginal-normal sphincter tone; no rectal masses  ASSESSMENT: 1. History of grade 3 endometrioid adenocarcinoma, status post robotic hysterectomy, BSO with lymph node dissection in 2012 2. Status post chemotherapy 2012 3. Status post radiotherapy of vagina, 2012 4. No evidence of disease 5. Vulvovaginal atrophy, asymptomatic  PLAN: 1. Return to the clinic in 1 year for follow-up 2.  Continue seeing Dr. Kary Kos and primary care 3.  Pap smear is done 4.  Continue with healthy eating and increase exercise 5.  Continue with calcium and vitamin D supplementation daily  A total of 15 minutes were spent face-to-face with the patient during this encounter and over half of that time dealt with counseling and coordination of care.  Joyice Faster, CMA  Brayton Mars, MD   Note: This dictation was prepared with Dragon dictation along with smaller phrase technology. Any transcriptional errors that result from this process are unintentional.

## 2017-11-27 NOTE — Addendum Note (Signed)
Addended by: Elouise Munroe on: 11/27/2017 04:33 PM   Modules accepted: Orders

## 2017-11-29 LAB — PAP IG W/ RFLX HPV ASCU: PAP SMEAR COMMENT: 0

## 2018-07-23 ENCOUNTER — Encounter: Payer: Medicare Other | Admitting: Obstetrics and Gynecology

## 2018-11-27 ENCOUNTER — Encounter: Payer: Medicare Other | Admitting: Obstetrics and Gynecology

## 2020-10-26 ENCOUNTER — Ambulatory Visit: Payer: Medicare Other | Admitting: Dermatology

## 2020-10-26 ENCOUNTER — Other Ambulatory Visit: Payer: Self-pay

## 2020-10-26 DIAGNOSIS — D492 Neoplasm of unspecified behavior of bone, soft tissue, and skin: Secondary | ICD-10-CM

## 2020-10-26 DIAGNOSIS — D485 Neoplasm of uncertain behavior of skin: Secondary | ICD-10-CM | POA: Diagnosis not present

## 2020-10-26 DIAGNOSIS — L820 Inflamed seborrheic keratosis: Secondary | ICD-10-CM | POA: Diagnosis not present

## 2020-10-26 DIAGNOSIS — L578 Other skin changes due to chronic exposure to nonionizing radiation: Secondary | ICD-10-CM

## 2020-10-26 DIAGNOSIS — C50919 Malignant neoplasm of unspecified site of unspecified female breast: Secondary | ICD-10-CM

## 2020-10-26 DIAGNOSIS — Z853 Personal history of malignant neoplasm of breast: Secondary | ICD-10-CM

## 2020-10-26 DIAGNOSIS — L821 Other seborrheic keratosis: Secondary | ICD-10-CM | POA: Diagnosis not present

## 2020-10-26 HISTORY — DX: Malignant neoplasm of unspecified site of unspecified female breast: C50.919

## 2020-10-26 NOTE — Progress Notes (Signed)
   New Patient Visit  Subjective  Cynthia [name] is a 79 y.o. female who presents for the following: Skin Problem (New pt here to have brown moles on her face, back, and abdomen checked, pt report moles are changing color and growing ) and hx of breast cancer (Pt report history of bilateral breast cancer she recently noticed a new non healing area on her chest she would like checked today ).  The following portions of the chart were reviewed this encounter and updated as appropriate:  Tobacco  Allergies  Meds  Problems  Med Hx  Surg Hx  Fam Hx     Review of Systems:  No other skin or systemic complaints except as noted in HPI or Assessment and Plan.  Objective  Well appearing patient in no apparent distress; mood and affect are within normal limits.  A focused examination was performed including face,chest,abdomen. Relevant physical exam findings are noted in the Assessment and Plan.  Objective  bilateral breast: Well healed scar with no evidence of recurrence, no lymphadenopathy.   Objective  Left ant waistline x 1, Left nose x 1 (2): Erythematous keratotic or waxy stuck-on papule or plaque.   Objective  Chest - Medial [facility]): Indurated red plaque         Assessment & Plan  Hx of breast cancer bilateral breast  no lymphadenopathy  Inflamed seborrheic keratosis (2) Left ant waistline x 1, Left nose x 1  Destruction of lesion - Left ant waistline x 1, Left nose x 1 Complexity: simple   Destruction method: cryotherapy   Informed consent: discussed and consent obtained   Timeout:  patient name, date of birth, surgical site, and procedure verified Lesion destroyed using liquid nitrogen: Yes   Region frozen until ice ball extended beyond lesion: Yes   Outcome: patient tolerated procedure well with no complications   Post-procedure details: wound care instructions given    Neoplasm of skin Chest - Medial (Center)  Skin / nail biopsy Type of biopsy: punch     Informed consent: discussed and consent obtained   Timeout: patient name, date of birth, surgical site, and procedure verified   Procedure prep:  Patient was prepped and draped in usual sterile fashion Anesthesia: the lesion was anesthetized in a standard fashion   Anesthetic:  1% lidocaine w/ epinephrine 1-100,000 buffered w/ 8.4% NaHCO3 Punch size:  3 mm Suture type: nylon   Hemostasis achieved with: suture   Outcome: patient tolerated procedure well   Post-procedure details: wound care instructions given    Specimen 1 - Surgical pathology Differential Diagnosis: R/O Recurrent Breast cancer vs other  Check Margins: No Indurated red plaque  Seborrheic Keratoses Back  - Stuck-on, waxy, tan-brown papules and plaques  - Discussed benign etiology and prognosis. - Observe - Call for any changes  Actinic Damage - chronic, secondary to cumulative UV radiation exposure/sun exposure over time - diffuse scaly erythematous macules with underlying dyspigmentation - Recommend daily broad spectrum sunscreen SPF 30+ to sun-exposed areas, reapply every 2 hours as needed.  - Call for new or changing lesions.  Return in about 6 days (around 11/01/2020) for suture removal.  I, Marye Round, CMA, am acting as scribe for Sarina Ser, MD .  Documentation: I have reviewed the above documentation for accuracy and completeness, and I agree with the above.  Sarina Ser, MD

## 2020-10-26 NOTE — Patient Instructions (Signed)

## 2020-10-31 ENCOUNTER — Encounter: Payer: Self-pay | Admitting: Dermatology

## 2020-11-01 ENCOUNTER — Other Ambulatory Visit: Payer: Self-pay

## 2020-11-01 ENCOUNTER — Ambulatory Visit: Payer: Medicare Other | Admitting: Dermatology

## 2020-11-01 ENCOUNTER — Encounter: Payer: Self-pay | Admitting: Dermatology

## 2020-11-01 DIAGNOSIS — C50919 Malignant neoplasm of unspecified site of unspecified female breast: Secondary | ICD-10-CM

## 2020-11-01 DIAGNOSIS — C50912 Malignant neoplasm of unspecified site of left female breast: Secondary | ICD-10-CM | POA: Diagnosis not present

## 2020-11-01 NOTE — Progress Notes (Signed)
   Follow-Up Visit   Subjective  Cynthia [name] is a 79 y.o. female who presents for the following: post op/suture removal - discuss pathology results (patient is here today for suture removal of the chest and to discuss pathology results).  The following portions of the chart were reviewed this encounter and updated as appropriate:  Tobacco  Allergies  Meds  Problems  Med Hx  Surg Hx  Fam Hx     Review of Systems:  No other skin or systemic complaints except as noted in HPI or Assessment and Plan.  Objective  Well appearing patient in no apparent distress; mood and affect are within normal limits.  A focused examination was performed including the chest. Relevant physical exam findings are noted in the Assessment and Plan.  Objective  Left Breast: Healing biopsy site.  Assessment & Plan  Metastatic breast cancer  Left Breast Biopsy proven - DERMAL CARCINOMA CONSISTENT WITH METASTATIC BREAST CARCINOMA   Diagnosis Skin , chest-medial (chest) DERMAL CARCINOMA CONSISTENT WITH METASTATIC BREAST CARCINOMA, SEE DESCRIPTION  Microscopic Description I note a prior history of breast carcinoma. Sections show lobules of atypical epithelioid cells arranged in cords with minimal areas of ductular formation in the dermis. These extend to the lateral and deep margins and have cytologic atypia. This is concerning for metastatic breast carcinoma given the clinical history. Cytokeratin 7 and cytokeratin AE1/AE3 are positive within these cells, confirmatory of a phenotype compatible with breast. ER, PR and Her 2 neu by ACIS will be performed and the results issued as an addendum for oncology purposes. (NDS:kh 10/31/20)  Results called to [place] on 10/31/20 at 3:08:01 PM; report read back and faxed to the office. (sr)  Duard Brady MD Dermatopathologist, Electronic Signature (Case signed 10/31/2020) PROGNOSTIC INDICATORS  Results: IMMUNOHISTOCHEMICAL AND MORPHOMETRIC ANALYSIS  PERFORMED MANUALLY The tumor cells are EQUIVOCAL for Her2 (2+). Her2 by FISH will be performed and results reported separately.  Estrogen Receptor: 95%, POSITIVE, STRONG STAINING INTENSITY  Progesterone Receptor: 95%, POSITIVE, STRONG STAINING INTENSITY  REFERENCE RANGE ESTROGEN RECEPTOR NEGATIVE 0% POSITIVE =>1%  REFERENCE RANGE PROGESTERONE RECEPTOR NEGATIVE 0% POSITIVE =>1%  Discussed pathology results in detail with patient and recommend referral to oncology. Patient prefers to discuss pathology results with a friend of her's that works in healthcare to see if there is a Financial planner she recommends. She will contact us with that information and we will obtain the referral.   Encounter for Removal of Sutures - Incision site at the chest is clean, dry and intact - Wound cleansed, sutures removed, wound cleansed and steri strips applied.  - Discussed pathology results showing metastatic breast cancer. - Patient advised to keep steri-strips dry until they fall off. - Scars remodel for a full year. - Once steri-strips fall off, patient can apply over-the-counter silicone scar cream each night to help with scar remodeling if desired. - Patient advised to call with any concerns or if they notice any new or changing lesions.   Other Related Procedures Ambulatory referral to Oncology  Return in about 2 months (around 01/01/2021) for ISK recheck, metastatic breast cancer follow up .  Luther Redo, CMA, am acting as scribe for Sarina Ser, MD .  Documentation: I have reviewed the above documentation for accuracy and completeness, and I agree with the above.  Sarina Ser, MD

## 2020-11-16 ENCOUNTER — Inpatient Hospital Stay: Payer: Medicare Other

## 2020-11-16 ENCOUNTER — Other Ambulatory Visit: Payer: Self-pay

## 2020-11-16 ENCOUNTER — Encounter: Payer: Self-pay | Admitting: Oncology

## 2020-11-16 ENCOUNTER — Inpatient Hospital Stay: Payer: Medicare Other | Attending: Oncology | Admitting: Oncology

## 2020-11-16 VITALS — BP 130/96 | HR 91 | Temp 98.8°F | Resp 18 | Ht 64.0 in | Wt 122.6 lb

## 2020-11-16 DIAGNOSIS — Z8542 Personal history of malignant neoplasm of other parts of uterus: Secondary | ICD-10-CM | POA: Diagnosis not present

## 2020-11-16 DIAGNOSIS — M79604 Pain in right leg: Secondary | ICD-10-CM | POA: Diagnosis not present

## 2020-11-16 DIAGNOSIS — Z7189 Other specified counseling: Secondary | ICD-10-CM

## 2020-11-16 DIAGNOSIS — C792 Secondary malignant neoplasm of skin: Secondary | ICD-10-CM | POA: Insufficient documentation

## 2020-11-16 DIAGNOSIS — E039 Hypothyroidism, unspecified: Secondary | ICD-10-CM | POA: Insufficient documentation

## 2020-11-16 DIAGNOSIS — Z923 Personal history of irradiation: Secondary | ICD-10-CM | POA: Insufficient documentation

## 2020-11-16 DIAGNOSIS — C50919 Malignant neoplasm of unspecified site of unspecified female breast: Secondary | ICD-10-CM | POA: Diagnosis not present

## 2020-11-16 DIAGNOSIS — M129 Arthropathy, unspecified: Secondary | ICD-10-CM | POA: Diagnosis not present

## 2020-11-16 DIAGNOSIS — Z9013 Acquired absence of bilateral breasts and nipples: Secondary | ICD-10-CM | POA: Insufficient documentation

## 2020-11-16 DIAGNOSIS — G4700 Insomnia, unspecified: Secondary | ICD-10-CM | POA: Diagnosis not present

## 2020-11-16 DIAGNOSIS — M79605 Pain in left leg: Secondary | ICD-10-CM | POA: Diagnosis not present

## 2020-11-16 DIAGNOSIS — Z170 Estrogen receptor positive status [ER+]: Secondary | ICD-10-CM | POA: Insufficient documentation

## 2020-11-16 DIAGNOSIS — C50912 Malignant neoplasm of unspecified site of left female breast: Secondary | ICD-10-CM | POA: Insufficient documentation

## 2020-11-16 DIAGNOSIS — M810 Age-related osteoporosis without current pathological fracture: Secondary | ICD-10-CM | POA: Insufficient documentation

## 2020-11-16 DIAGNOSIS — Z79899 Other long term (current) drug therapy: Secondary | ICD-10-CM | POA: Diagnosis not present

## 2020-11-16 NOTE — Progress Notes (Signed)
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  female with PMH listed below was seen in consultation at the request of  Ralene Bathe, MD  for evaluation of metastatic breast cancer Patient was previously seen by Dr. Jeb Levering last seen in 2017. 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 Clarene Essex, Norma Fredrickson platinum, Taxol finished in November 2012, also status post radiation finished in October 2012.  More remotely she has a history of breast cancer in 1987/1988, status post bilateral mastectomy followed by tamoxifen for 5 years.  Patient has noticed an area on her anterior chest wall for couple of months.  It is hard, nontender.  She recently was seen by dermatologist and had a biopsy of the site on 10/26/2020 Biopsy showed dermal carcinoma consistent with metastatic breast carcinoma.  ER 90%, PR 90%, HER-2 IHC equivocal, FISH negative.  Patient reports feeling well otherwise.  Denies any new bone pain.  She has chronic aches and pains, especially bilateral lower extremities.  Appetite is fair.  No unintentional weight loss  Review of Systems  Constitutional: Negative for appetite change, chills, fatigue and fever.  HENT:   Negative for hearing loss and voice change.   Eyes: Negative for eye problems.  Respiratory: Negative for chest tightness and cough.   Cardiovascular: Negative for chest pain.  Gastrointestinal: Negative for abdominal distention, abdominal pain and blood in stool.  Endocrine: Negative for hot flashes.  Genitourinary: Negative for difficulty urinating and frequency.    Musculoskeletal: Negative for arthralgias.  Skin: Negative for itching and rash.       Anterior chest wall skin lesion status post biopsy  Neurological: Negative for extremity weakness.  Hematological: Negative for adenopathy.  Psychiatric/Behavioral: Negative for confusion.    MEDICAL HISTORY:  Past Medical History:  Diagnosis Date  . Arthritis   . Breast cancer ([place])   . Hypothyroidism   . Insomnia   . Osteoporosis   . Squamous cell carcinoma    RT ARM  . Uterine cancer [facility])     SURGICAL HISTORY: Past Surgical History:  Procedure Laterality Date  . ABDOMINAL HYSTERECTOMY  05/2011   BSO- RADICAL AND CHEMOTHERAPY COMPLETE   . MASTECTOMY     LEFT WITH AXILLARY NODE DISSECTION, RADICAL RIGHT PROPHYLATIC W/O AXILLARY DISSECTION  . PLACEMENT OF BREAST IMPLANTS     SILICONE RECONSTRUCTION    SOCIAL HISTORY: Social History   Socioeconomic History  . Marital status: Married    Spouse name: Not on file  . Number of children: Not on file  . Years of education: Not on file  . Highest education level: Not on file  Occupational History  . Not on file  Tobacco Use  . Smoking status: Never Smoker  . Smokeless tobacco: Never Used  Vaping Use  . Vaping Use: Never used  Substance and Sexual Activity  . Alcohol use: No    Alcohol/week: 0.0 standard drinks  . Drug use: No  . Sexual activity: Yes    Birth control/protection: Post-menopausal, Surgical  Other Topics Concern  . Not on file  Social History Narrative  . Not on file   Social Determinants of  Health   Financial Resource Strain:   . Difficulty of Paying Living Expenses: Not on file  Food Insecurity:   . Worried About Charity fundraiser in the Last Year: Not on file  . Ran Out of Food in the Last Year: Not on file  Transportation Needs:   . Lack of Transportation (Medical): Not on file  . Lack of Transportation (Non-Medical): Not on file  Physical Activity:   . Days of Exercise per Week: Not on file  .  Minutes of Exercise per Session: Not on file  Stress:   . Feeling of Stress : Not on file  Social Connections:   . Frequency of Communication with Friends and Family: Not on file  . Frequency of Social Gatherings with Friends and Family: Not on file  . Attends Religious Services: Not on file  . Active Member of Clubs or Organizations: Not on file  . Attends Archivist Meetings: Not on file  . Marital Status: Not on file  Intimate Partner Violence:   . Fear of Current or Ex-Partner: Not on file  . Emotionally Abused: Not on file  . Physically Abused: Not on file  . Sexually Abused: Not on file    FAMILY HISTORY: Family History  Problem Relation Age of Onset  . Osteoporosis Mother   . Dementia Mother   . Hypertension Father   . Heart attack Father   . Alzheimer's disease Brother   . Diabetes Neg Hx   . Cancer Neg Hx     ALLERGIES:  is allergic to sulfa antibiotics.  MEDICATIONS:  Current Outpatient Medications  Medication Sig Dispense Refill  . alendronate (FOSAMAX) 70 MG tablet Take by mouth. Take 1 tablet (70 mg total) by mouth every 7 (seven) days Take with a full glass of water. Do not lie down for the next 30 min    . calcium carbonate (OS-CAL - DOSED IN MG OF ELEMENTAL CALCIUM) 1250 (500 CA) MG tablet Take by mouth.    . cholecalciferol (VITAMIN D) 1000 units tablet Take 1,000 Units by mouth daily.    Marland Kitchen levothyroxine (SYNTHROID) 50 MCG tablet TAKE ONE TABLET DAILY ON EMPTY STOMACH.    . meloxicam (MOBIC) 15 MG tablet Take 1 tablet by mouth as needed.    . Multiple Vitamins-Minerals (PRESERVISION AREDS PO) Take by mouth.    . zolpidem (AMBIEN) 10 MG tablet Take 10 mg by mouth at bedtime as needed for sleep.      No current facility-administered medications for this visit.     PHYSICAL EXAMINATION: ECOG PERFORMANCE STATUS: 0 - Asymptomatic Vitals:   11/16/20 0944  BP: (!) 130/96  Pulse: 91  Resp: 18  Temp: 98.8 F (37.1 C)   Filed Weights    11/16/20 0944  Weight: 122 lb 9.6 oz (55.6 kg)    Physical Exam Constitutional:      General: She is not in acute distress. HENT:     Head: Normocephalic and atraumatic.  Eyes:     General: No scleral icterus. Cardiovascular:     Rate and Rhythm: Normal rate and regular rhythm.     Heart sounds: Normal heart sounds.  Pulmonary:     Effort: Pulmonary effort is normal. No respiratory distress.     Breath sounds: No wheezing.  Abdominal:     General: Bowel sounds are normal. There is no distension.     Palpations: Abdomen is soft.  Musculoskeletal:        General: No deformity.  Normal range of motion.     Cervical back: Normal range of motion and neck supple.  Skin:    General: Skin is warm and dry.     Findings: No erythema or rash.  Neurological:     Mental Status: She is alert and oriented to person, place, and time. Mental status is at baseline.     Cranial Nerves: No cranial nerve deficit.     Coordination: Coordination normal.  Psychiatric:        Mood and Affect: Mood normal.     LABORATORY DATA:  I have reviewed the data as listed Lab Results  Component Value Date   WBC 5.5 06/21/2017   HGB 13.7 06/21/2017   HCT 40.4 06/21/2017   MCV 92.5 06/21/2017   PLT 259 06/21/2017   No results for input(s): NA, K, CL, CO2, GLUCOSE, BUN, CREATININE, CALCIUM, GFRNONAA, GFRAA, PROT, ALBUMIN, AST, ALT, ALKPHOS, BILITOT, BILIDIR, IBILI in the last 8760 hours. Iron/TIBC/Ferritin/ %Sat No results found for: IRON, TIBC, FERRITIN, IRONPCTSAT    RADIOGRAPHIC STUDIES: I have personally reviewed the radiological images as listed and agreed with the findings in the report. No results found.    ASSESSMENT & PLAN:  1. Metastatic breast cancer ([place])   2. 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 Goals of care, counseling/discussion   4. Osteoporosis without current pathological fracture, unspecified osteoporosis type    Pathology was reviewed and discussed with patient. Biopsy results is  consistent with metastatic breast cancer, ER/PR positive, HER-2 negative. Recommend patient to proceed with PET scan for staging. She may also benefit from additional [place] PET scan Consider MRI brain in the future. Discussed with patient about possible treatment options including antiestrogen treatment, options of aromatase inhibitor versus tamoxifen, and CDK inhibitors.  Patient has history of osteoporosis, 08/26/2020, bone density showed osteoporosis, interval decrease in bone density comparing to 04/25/2010.  Patient has been on oral bisphosphonate treatment. Tamoxifen may be a more appealing option to patient considering her severe osteoporosis.  Orders Placed This Encounter  Procedures  . NM PET Image Initial (PI) Skull Base To Thigh    Standing Status:   Future    Standing Expiration Date:   11/16/2021    Order Specific Question:   If indicated for the ordered procedure, I authorize the administration of a radiopharmaceutical per Radiology protocol    Answer:   Yes    Order Specific Question:   Preferred imaging location?    Answer:   [facility]    All questions were answered. The patient knows to call the clinic with any problems questions or concerns.  cc Ralene Bathe, MD    Return of visit: To be determined.  After PET scan Thank you for this kind referral and the opportunity to participate in the care of this patient. A copy of today's note is routed to referring provider    Earlie Server, MD, PhD Hematology Oncology [facility] Pager- [phone] 11/16/2020

## 2020-11-16 NOTE — Progress Notes (Signed)
Pt here to establish care for metastatic breast cancer.

## 2020-11-23 ENCOUNTER — Telehealth: Payer: Self-pay

## 2020-11-23 NOTE — Telephone Encounter (Signed)
[place] message from [facility] from PET scan department: Good Morning! I just got a phone call from the following patient who was scheduled for a PET scan on December 21. She stated that she has been watching the weather and it is supposed to be below freezing that day and showers so she wanted to reschedule her appointment. I have placed her on the schedule for December 30th @ 8:30am. Just wanted to make you aware so that any other appointments could be adjusted if need be. Thank you

## 2020-11-23 NOTE — Telephone Encounter (Signed)
Done.. Pt has been sched for MD follow up for results a few days after PET  I was unable to reach pt py phone and pts phone wasn't set upt to leave messages A reminder letter will be mailed out

## 2020-12-08 ENCOUNTER — Ambulatory Visit
Admission: RE | Admit: 2020-12-08 | Discharge: 2020-12-08 | Disposition: A | Discharge status: Home / Self Care | Payer: Medicare Other | Source: Ambulatory Visit | Attending: Oncology | Admitting: Oncology

## 2020-12-08 ENCOUNTER — Other Ambulatory Visit: Payer: Self-pay

## 2020-12-08 DIAGNOSIS — C50919 Malignant neoplasm of unspecified site of unspecified female breast: Secondary | ICD-10-CM | POA: Diagnosis present

## 2020-12-08 DIAGNOSIS — R222 Localized swelling, mass and lump, trunk: Secondary | ICD-10-CM | POA: Diagnosis not present

## 2020-12-08 LAB — GLUCOSE, CAPILLARY: Glucose-Capillary: 89 mg/dL (ref 70–99)

## 2020-12-08 MED ORDER — FLUDEOXYGLUCOSE F - 18 (FDG) INJECTION
6.3000 | Freq: Once | INTRAVENOUS | Status: AC | PRN
  Administered 2020-12-08: 09:00:00 6.79 via INTRAVENOUS

## 2020-12-14 ENCOUNTER — Inpatient Hospital Stay: Payer: Medicare Other | Admitting: Oncology

## 2020-12-20 ENCOUNTER — Encounter: Payer: Self-pay | Admitting: Oncology

## 2020-12-20 ENCOUNTER — Inpatient Hospital Stay: Payer: Medicare Other | Attending: Oncology | Admitting: Oncology

## 2020-12-20 VITALS — BP 132/80 | HR 83 | Temp 99.6°F | Resp 18 | Wt 124.1 lb

## 2020-12-20 DIAGNOSIS — Z79810 Long term (current) use of selective estrogen receptor modulators (SERMs): Secondary | ICD-10-CM | POA: Diagnosis not present

## 2020-12-20 DIAGNOSIS — C50919 Malignant neoplasm of unspecified site of unspecified female breast: Secondary | ICD-10-CM | POA: Diagnosis not present

## 2020-12-20 DIAGNOSIS — C7989 Secondary malignant neoplasm of other specified sites: Secondary | ICD-10-CM | POA: Insufficient documentation

## 2020-12-20 DIAGNOSIS — Z170 Estrogen receptor positive status [ER+]: Secondary | ICD-10-CM | POA: Insufficient documentation

## 2020-12-20 DIAGNOSIS — M129 Arthropathy, unspecified: Secondary | ICD-10-CM | POA: Diagnosis not present

## 2020-12-20 DIAGNOSIS — G4700 Insomnia, unspecified: Secondary | ICD-10-CM | POA: Diagnosis not present

## 2020-12-20 DIAGNOSIS — C50912 Malignant neoplasm of unspecified site of left female breast: Secondary | ICD-10-CM | POA: Diagnosis present

## 2020-12-20 DIAGNOSIS — E039 Hypothyroidism, unspecified: Secondary | ICD-10-CM | POA: Diagnosis not present

## 2020-12-20 DIAGNOSIS — Z8542 Personal history of malignant neoplasm of other parts of uterus: Secondary | ICD-10-CM | POA: Diagnosis not present

## 2020-12-20 DIAGNOSIS — Z79899 Other long term (current) drug therapy: Secondary | ICD-10-CM | POA: Insufficient documentation

## 2020-12-20 DIAGNOSIS — M810 Age-related osteoporosis without current pathological fracture: Secondary | ICD-10-CM | POA: Insufficient documentation

## 2020-12-20 NOTE — Progress Notes (Signed)
Pt here for follow up. No new concerns voiced.   

## 2020-12-20 NOTE — Progress Notes (Addendum)
Hematology/Oncology progress note [facility] [phone] [phone]   Patient Care Team: [name], MD as PCP - General (Family Medicine)  REFERRING PROVIDER: [name], MD  CHIEF COMPLAINTS/REASON FOR VISIT:  Evaluation of metastatic breast cancer  HISTORY OF PRESENTING ILLNESS:   Cynthia [name] is a  80 y.o.  female with PMH listed below was seen in consultation at the request of  [name], MD  for evaluation of metastatic breast cancer Patient was previously seen by Dr. Jeb Levering last seen in 2017. 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 Clarene Essex, Norma Fredrickson platinum, Taxol finished in November 2012, also status post radiation finished in October 2012.  More remotely she has a history of breast cancer in 1987/1988, status post bilateral mastectomy followed by tamoxifen for 5 years.  Patient has noticed an area on her anterior chest wall for couple of months.  It is hard, nontender.  She recently was seen by dermatologist and had a biopsy of the site on 10/26/2020 Biopsy showed dermal carcinoma consistent with metastatic breast carcinoma.  ER 90%, PR 90%, HER-2 IHC equivocal, FISH negative.  Patient reports feeling well otherwise.  Denies any new bone pain.  She has chronic aches and pains, especially bilateral lower extremities.  Appetite is fair.  No unintentional weight loss  INTERVAL HISTORY Cynthia [name] is a 80 y.o. female who has above history reviewed by me today presents for follow up visit for management of breast cancer local chest wall recurrence Problems and complaints are listed below: Patient has not had a scan done during [facility] complaints.  Review of Systems  Constitutional: Negative for appetite change, chills, fatigue and fever.  HENT:   Negative for hearing loss and voice change.   Eyes: Negative for eye problems.  Respiratory: Negative for chest  tightness and cough.   Cardiovascular: Negative for chest pain.  Gastrointestinal: Negative for abdominal distention, abdominal pain and blood in stool.  Endocrine: Negative for hot flashes.  Genitourinary: Negative for difficulty urinating and frequency.   Musculoskeletal: Negative for arthralgias.  Skin: Negative for itching and rash.       Anterior chest wall skin lesion status post biopsy  Neurological: Negative for extremity weakness.  Hematological: Negative for adenopathy.  Psychiatric/Behavioral: Negative for confusion.    MEDICAL HISTORY:  Past Medical History:  Diagnosis Date  . Arthritis   . Breast cancer ([place])   . Hypothyroidism   . Insomnia   . Metastatic breast carcinoma ([place]) 10/26/2020    chest-medial  DERMAL CARCINOMA CONSISTENT WITH METASTATIC BREAST CARCINOMA,  . Osteoporosis   . Squamous cell carcinoma    RT ARM  . Uterine cancer [facility])     SURGICAL HISTORY: Past Surgical History:  Procedure Laterality Date  . ABDOMINAL HYSTERECTOMY  05/2011   BSO- RADICAL AND CHEMOTHERAPY COMPLETE   . MASTECTOMY     LEFT WITH AXILLARY NODE DISSECTION, RADICAL RIGHT PROPHYLATIC W/O AXILLARY DISSECTION  . PLACEMENT OF BREAST IMPLANTS     SILICONE RECONSTRUCTION    SOCIAL HISTORY: Social History   Socioeconomic History  . Marital status: Married    Spouse name: Not on file  . Number of children: Not on file  . Years of education: Not on file  . Highest education level: Not on file  Occupational History  . Not on file  Tobacco Use  . Smoking status: Never Smoker  . Smokeless tobacco: Never Used  Vaping Use  . Vaping Use: Never used  Substance and Sexual Activity  . Alcohol use: No    Alcohol/week: 0.0 standard drinks  . Drug use: No  . Sexual activity: Yes    Birth control/protection: Post-menopausal, Surgical  Other Topics Concern  . Not on file  Social History Narrative  . Not on file   Social Determinants of Health   Financial Resource Strain: Not  on file  Food Insecurity: Not on file  Transportation Needs: Not on file  Physical Activity: Not on file  Stress: Not on file  Social Connections: Not on file  Intimate Partner Violence: Not on file    FAMILY HISTORY: Family History  Problem Relation Age of Onset  . Osteoporosis Mother   . Dementia Mother   . Hypertension Father   . Heart attack Father   . Alzheimer's disease Brother   . Diabetes Neg Hx   . Cancer Neg Hx     ALLERGIES:  is allergic to sulfa antibiotics.  MEDICATIONS:  Current Outpatient Medications  Medication Sig Dispense Refill  . calcium carbonate (OS-CAL - DOSED IN MG OF ELEMENTAL CALCIUM) 1250 (500 CA) MG tablet Take by mouth.    . cholecalciferol (VITAMIN D) 1000 units tablet Take 1,000 Units by mouth daily.    Marland Kitchen levothyroxine (SYNTHROID) 50 MCG tablet TAKE ONE TABLET DAILY ON EMPTY STOMACH.    . meloxicam (MOBIC) 15 MG tablet Take 1 tablet by mouth as needed.    . Multiple Vitamins-Minerals (PRESERVISION AREDS PO) Take by mouth.    . zolpidem (AMBIEN) 10 MG tablet Take 10 mg by mouth at bedtime as needed for sleep.     Marland Kitchen alendronate (FOSAMAX) 70 MG tablet Take by mouth. Take 1 tablet (70 mg total) by mouth every 7 (seven) days Take with a full glass of water. Do not lie down for the next 30 min (Patient not taking: Reported on 12/20/2020)     No current facility-administered medications for this visit.     PHYSICAL EXAMINATION: ECOG PERFORMANCE STATUS: 0 - Asymptomatic Vitals:   12/20/20 1113  BP: 132/80  Pulse: 83  Resp: 18  Temp: 99.6 F (37.6 C)   Filed Weights   12/20/20 1113  Weight: 124 lb 1.6 oz (56.3 kg)    Physical Exam Constitutional:      General: She is not in acute distress. HENT:     Head: Normocephalic and atraumatic.  Eyes:     General: No scleral icterus. Cardiovascular:     Rate and Rhythm: Normal rate and regular rhythm.     Heart sounds: Normal heart sounds.  Pulmonary:     Effort: Pulmonary effort is normal.  No respiratory distress.     Breath sounds: No wheezing.  Abdominal:     General: Bowel sounds are normal. There is no distension.     Palpations: Abdomen is soft.  Musculoskeletal:        General: No deformity. Normal range of motion.     Cervical back: Normal range of motion and neck supple.  Skin:    General: Skin is warm and dry.     Findings: No erythema or rash.  Neurological:     Mental Status: She is alert and oriented to person, place, and time. Mental status is at baseline.     Cranial Nerves: No cranial nerve deficit.     Coordination: Coordination normal.  Psychiatric:        Mood and Affect: Mood normal.  LABORATORY DATA:  I have reviewed the data as listed Lab Results  Component Value Date   WBC 5.5 06/21/2017   HGB 13.7 06/21/2017   HCT 40.4 06/21/2017   MCV 92.5 06/21/2017   PLT 259 06/21/2017   No results for input(s): NA, K, CL, CO2, GLUCOSE, BUN, CREATININE, CALCIUM, GFRNONAA, GFRAA, PROT, ALBUMIN, AST, ALT, ALKPHOS, BILITOT, BILIDIR, IBILI in the last 8760 hours. Iron/TIBC/Ferritin/ %Sat No results found for: IRON, TIBC, FERRITIN, IRONPCTSAT    RADIOGRAPHIC STUDIES: I have personally reviewed the radiological images as listed and agreed with the findings in the report. NM PET Image Initial (PI) Skull Base To Thigh  Result Date: 12/08/2020 CLINICAL DATA:  Initial treatment strategy for metastatic breast cancer. Remote history of breast cancer with recurrent chest wall tumor recently biopsied. EXAM: NUCLEAR MEDICINE PET SKULL BASE TO THIGH TECHNIQUE: 6.79 mCi F-18 FDG was injected intravenously. Full-ring PET imaging was performed from the skull base to thigh after the radiotracer. CT data was obtained and used for attenuation correction and anatomic localization. Fasting blood glucose: 89 mg/dl COMPARISON:  None. FINDINGS: Mediastinal blood pool activity: SUV max 2.42 Liver activity: SUV max NA NECK: No hypermetabolic lymph nodes in the neck. Incidental  CT findings: none CHEST: Soft tissue mass involving the chest wall just to the left of midline measures 2.5 x 1.5 cm and is hypermetabolic with SUV max of 6.72. This is consistent with the recent biopsy demonstrating neoplasm. There are bilateral breast prostheses. No supraclavicular or axillary or subpectoral lymphadenopathy is identified. I do not see any other chest wall nodules. No enlarged or hypermetabolic mediastinal or hilar lymph nodes. No worrisome pulmonary lesions. Incidental CT findings: none ABDOMEN/PELVIS: No PET-CT findings to suggest metastatic disease involving the abdomen/pelvis. No hepatic or adrenal gland lesions. No enlarged or hypermetabolic lymphadenopathy. Incidental CT findings: Significant and diffuse colonic diverticulosis but no findings for diverticulitis. Remarkably little atherosclerotic calcifications for the patient's age. SKELETON: No hypermetabolic bone lesions to suggest osseous metastatic disease. No lytic or sclerotic bone lesions are seen on the CT scan. Incidental CT findings: none IMPRESSION: 1. Hypermetabolic left paracentral chest wall mass consistent with known recurrent breast cancer. 2. No other metastatic disease is identified. Electronically Signed   By: Marijo Sanes M.D.   On: 12/08/2020 11:31      ASSESSMENT & PLAN:  1. Metastatic breast cancer ([place])   2. Osteoporosis without current pathological fracture, unspecified osteoporosis type   3. Chest wall recurrence of breast cancer, unspecified laterality ([place])   4. History of endometrial cancer    #stage IV breast cancer-local chest wall recurrence PET scan was independently reviewed and was discussed with patient.  Hypermetabolic chest wall mass. No other hypermetabolic disease sites. Check MRI brain to rule out CNS involvement.  Discussed with patient that I recommend her to establish care with surgery for evaluation of feasibility of resection of chest wall recurrence.  +/- Adjuvant radiation plus  antiestrogen treatment. I will reserve CDK 4/5 inhibitor treatments if she develops systemic metastasis.  Discussed about options including antiestrogen treatment, options of aromatase inhibitor versus tamoxifen, and potential side effects. Patient has history of osteoporosis, 08/26/2020, bone density showed osteoporosis, interval decrease in bone density comparing to 04/25/2010.  Patient has been on oral bisphosphonate treatment. History of endometrial cancer status postsurgery, adjuvant chemotherapy and radiation in 2012.  Orders Placed This Encounter  Procedures  . MR Brain W Wo Contrast    Standing Status:   Future    Standing Expiration  Date:   12/20/2021    Order Specific Question:   If indicated for the ordered procedure, I authorize the administration of contrast media per Radiology protocol    Answer:   Yes    Order Specific Question:   What is the patient's sedation requirement?    Answer:   No Sedation    Order Specific Question:   Does the patient have a pacemaker or implanted devices?    Answer:   No    Order Specific Question:   Use SRS Protocol?    Answer:   Yes    Order Specific Question:   Preferred imaging location?    Answer:   [facility] (table limit - 550lbs)  . Ambulatory referral to General Surgery    Referral Priority:   Routine    Referral Type:   Surgical    Referral Reason:   Specialty Services Required    Referred to Provider:   Herbert Pun, MD    Requested Specialty:   General Surgery    Number of Visits Requested:   1    All questions were answered. The patient knows to call the clinic with any problems questions or concerns.  cc [name], MD    Return of visit: To be determined  Earlie Server, MD, PhD Hematology Oncology [facility] at [facility] Pager- [phone] 12/20/2020

## 2020-12-29 ENCOUNTER — Ambulatory Visit: Payer: Medicare Other

## 2020-12-29 ENCOUNTER — Telehealth: Payer: Self-pay

## 2020-12-29 NOTE — Telephone Encounter (Signed)
Called [facility] surgery to follow up on referral sent on 1/12 to Dr. Peyton Najjar. Office rep states that they received referral but have been trying to call patient and no answer, no VM set up to leave message. I also called pt to let her know, but unable to reach her.

## 2021-01-02 ENCOUNTER — Ambulatory Visit
Admission: RE | Admit: 2021-01-02 | Discharge: 2021-01-02 | Disposition: A | Discharge status: Home / Self Care | Payer: Medicare Other | Source: Ambulatory Visit | Attending: Oncology | Admitting: Oncology

## 2021-01-02 ENCOUNTER — Other Ambulatory Visit: Payer: Self-pay

## 2021-01-02 DIAGNOSIS — C50919 Malignant neoplasm of unspecified site of unspecified female breast: Secondary | ICD-10-CM | POA: Diagnosis not present

## 2021-01-02 MED ORDER — GADOBUTROL 1 MMOL/ML IV SOLN
5.0000 mL | Freq: Once | INTRAVENOUS | Status: AC | PRN
  Administered 2021-01-02: 5 mL via INTRAVENOUS

## 2021-01-03 ENCOUNTER — Telehealth: Payer: Self-pay

## 2021-01-03 NOTE — Telephone Encounter (Signed)
Thanks for the update

## 2021-01-03 NOTE — Telephone Encounter (Signed)
Spoke to patient and she states that she spoke to Dr. Kary Kos (PCP) this morning and he told her about the PET results. Pt also states that she spoke to Dr. Kary Kos about getting and appt wit Dr. Bary Castilla instead and he agreed.

## 2021-01-03 NOTE — Telephone Encounter (Signed)
-----   Message from Earlie Server, MD sent at 01/02/2021  7:44 PM EST ----- Please let her know that her MRI brain showed no cancer involvement.  Also please follow up with her to see if she has an appointment to see Dr.Cintron thanks.

## 2021-01-09 ENCOUNTER — Other Ambulatory Visit: Payer: Self-pay | Admitting: General Surgery

## 2021-01-09 NOTE — Progress Notes (Signed)
Subjective:     Patient ID: Cynthia [name] is a 80 y.o. female.  HPI  The following portions of the patient's history were reviewed and updated as appropriate.  This a new patient is here today for: office visit. The patient is here today for evaluation of a recurrent left breast cancer. She was referred by Dr. Kary Kos. Patient recently had a PET scan and MRI brain done. The patient has been seen by Dr. Tasia Catchings at the [facility]. She was seen in November 2021 by Dr. 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   The patient has a history of uterine and endometrial cancer as well as bilateral breast cancer. She did complete chemotherapy and radiation in the past for this. Patient did have bilateral mastectomy with reconstruction.   The patient did take advantage of the Covid vaccine and her booster.   Patient has retired from working at the blood [employer] at [employer]. 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        Chief Complaint  Patient presents with  . Treatment Plan Discussion    breast cancer, left     BP 124/82   Pulse 94   Temp 36.7 C (98 F)   Ht 162.6 cm (5' 4" )   Wt 56.2 kg (124 lb)   SpO2 98%   BMI 21.28 kg/m       Past Medical History:  Diagnosis Date  . Arthritis   . Cancer (CMS-HCC) 10/26/2020   Breast, metastatic  . Encounter for Zostavax administration 03/10/2008  . Endometrial cancer (CMS-HCC) 2012  . History of chemotherapy   . History of radiation therapy   . Hypothyroidism   . Insomnia   . Osteoporosis   . SCC (squamous cell carcinoma), arm   . Uterine cancer (CMS-HCC) 05/25/1999          Past Surgical History:  Procedure Laterality Date  . COLONOSCOPY  12/10/2010  . HYSTERECTOMY  05/2011   abdominal  . MASTECTOMY Bilateral    with reconstruction  . placement of breast implants    . scc                 OB History    Gravida  1   Para  1   Term      Preterm      AB      Living        SAB      IAB      Ectopic      Molar      Multiple      Live Births          Obstetric Comments  Age at first period 77 Age of first pregnancy 16         Social History          Socioeconomic History  . Marital status: Married    Spouse name: Not on file  . Number of children: Not on file  . Years of education: Not on file  . Highest education level: Not on file  Occupational History  . Not on file  Tobacco Use  . Smoking status: Never Smoker  . Smokeless tobacco: Never Used  Substance and Sexual Activity  . Alcohol use: Never  . Drug use: Never  . Sexual activity: Not on file  Other Topics Concern  . Not  on file  Social History Narrative  . Not on file   Social Determinants of Health   Financial Resource Strain: Not on file  Food Insecurity: Not on file  Transportation Needs: Not on file           Allergies  Allergen Reactions  . Sulfa (Sulfonamide Antibiotics) Nausea    Current Medications        Current Outpatient Medications  Medication Sig Dispense Refill  . alendronate (FOSAMAX) 70 MG tablet Take 1 tablet (70 mg total) by mouth every 7 (seven) days Take with a full glass of water. Do not lie down for the next 30 min. 4 tablet 11  . CALCIUM CARBONATE (CALCIUM 500 ORAL) Take by mouth.    . meloxicam (MOBIC) 15 MG tablet Take 1 tablet (15 mg total) by mouth once daily 30 tablet 0  . multivitamin tablet Take 1 tablet by mouth once daily.    . naproxen sodium (ALEVE) 220 MG tablet Take 220 mg by mouth once daily as needed for Pain    . NON FORMULARY Take 200 mg by mouth once daily L-theanine      . SYNTHROID 50 mcg tablet TAKE ONE TABLET DAILY ON EMPTY STOMACH. 30 tablet 11  . zolpidem (AMBIEN) 10 mg tablet Take 1 tablet (10 mg total) by mouth nightly as needed for Sleep 30 tablet 1  . clotrimazole (MYCELEX)  10 mg troche Take 1 tablet (10 mg total) by mouth 3 (three) times daily Dissolve tablet(troche) slowly and completely in mouth. (Patient not taking: Reported on 01/09/2021  ) 20 tablet 0  . DOCUSATE SODIUM (COLACE ORAL) Take by mouth. (Patient not taking: Reported on 01/09/2021  )    . ZOVIRAX 5 % cream APPLY A SMALL AMOUNT TO AFFECTED AREA TWICE A DAY AS NEEDED (Patient not taking: Reported on 01/09/2021) 30 g 0   No current facility-administered medications for this visit.           Family History  Problem Relation Age of Onset  . Osteoporosis (Thinning of bones) Mother   . Dementia Mother   . High blood pressure (Hypertension) Father   . Coronary Artery Disease (Blocked arteries around heart) Father   . Myocardial Infarction (Heart attack) Father   . Alzheimer's disease Brother     .  Review of Systems  Constitutional: Negative for chills and fever.  Respiratory: Negative for cough.        Objective:   Physical Exam Exam conducted with a chaperone present.  Constitutional:      Appearance: Normal appearance.  Cardiovascular:     Rate and Rhythm: Normal rate and regular rhythm.     Pulses: Normal pulses.     Heart sounds: Normal heart sounds.  Pulmonary:     Effort: Pulmonary effort is normal.     Breath sounds: Normal breath sounds.  Chest:  Modest dull erythema/ discoloration over the mass.  Breasts:     Right: No axillary adenopathy or supraclavicular adenopathy.     Left: No axillary adenopathy or supraclavicular adenopathy.        Comments: Reconstructed breasts show no mass, thickening or evidence of capsular regularity. Musculoskeletal:     Cervical back: Neck supple.  Lymphadenopathy:     Upper Body:     Right upper body: No supraclavicular or axillary adenopathy.     Left upper body: No supraclavicular or axillary adenopathy.  No evidence of upper extremity lymphedema.  Skin:    General: Skin is  warm and dry.  Neurological:     Mental  Status: She is alert and oriented to person, place, and time.  Psychiatric:        Mood and Affect: Mood normal.        Behavior: Behavior normal.     Labs and Radiology:   Dermatology biopsy of October 26, 2020:  Skin , chest-medial (chest) DERMAL CARCINOMA CONSISTENT WITH METASTATIC BREAST CARCINOMA, SEE DESCRIPTION GROUP 5: HER2 **NEGATIVE** Estrogen Receptor: 95%, POSITIVE, STRONG STAINING INTENSITY Progesterone Receptor: 95%, POSITIVE, STRONG STAINING INTENSITY  PET/CT of December 08, 2020:  This study was independently reviewed.  Single foci of activity overlying the left side of the sternum.  On CT images no suggestion of prosthesis leak.  Brain MRI Jan uary 24, 2022:  No evidence of intracranial metastases.  Bone density August 26, 2020:  Osteoporosis, interval decrease in bone mineral density from 2011.     Assessment:     New left chest wall mass 30+ years post mastectomy.  Likely new primary versus occult metastatic disease.    Plan:     Indications for surgical excision and likely radiation therapy to follow were reviewed.  Based on its size, it would likely respond poorly to primary radiation therapy.  Role of antiestrogen therapy after surgical treatment was discussed.  Patient might benefit from tamoxifen versus an aromatase inhibitor with her underlying osteoporosis.   Patient to proceed with surgery.  Plan for wide excision and primary closure.      Entered by Ledell Noss, CMA, acting as a scribe for Dr. Hervey Ard, MD.   The documentation recorded by the scribe accurately reflects the service I personally performed and the decisions made by me.   Robert Bellow, MD FACS

## 2021-01-13 ENCOUNTER — Encounter
Admission: RE | Admit: 2021-01-13 | Discharge: 2021-01-13 | Disposition: A | Discharge status: Home / Self Care | Payer: Medicare Other | Source: Ambulatory Visit | Attending: General Surgery | Admitting: General Surgery

## 2021-01-13 ENCOUNTER — Other Ambulatory Visit: Payer: Self-pay

## 2021-01-13 NOTE — Patient Instructions (Signed)
Your procedure is scheduled on: 01/18/21 Report to [facility]. Must check in at the admitting desk To find out your arrival time please call [phone] between 1PM - 3PM on 01/17/21 .  Remember: Instructions that are not followed completely may result in serious medical risk, up to and including death, or upon the discretion of your surgeon and anesthesiologist your surgery may need to be rescheduled.     _X__ 1. Do not eat food after midnight the night before your procedure.                 No gum chewing or hard candies. You may drink clear liquids up to 2 hours                 before you are scheduled to arrive for your surgery- DO not drink clear                 liquids within 2 hours of the start of your surgery.                 Clear Liquids include:  water, apple juice without pulp, clear carbohydrate                 drink such as Clearfast or Gatorade, Black Coffee or Tea (Do not add                 anything to coffee or tea). Diabetics water only  __X__2.  On the morning of surgery brush your teeth with toothpaste and water, you                 may rinse your mouth with mouthwash if you wish.  Do not swallow any              toothpaste of mouthwash.     _X__ 3.  No Alcohol for 24 hours before or after surgery.   _X__ 4.  Do Not Smoke or use e-cigarettes For 24 Hours Prior to Your Surgery.                 Do not use any chewable tobacco products for at least 6 hours prior to                 surgery.  ____  5.  Bring all medications with you on the day of surgery if instructed.   __X__  6.  Notify your doctor if there is any change in your medical condition      (cold, fever, infections).     Do not wear jewelry, make-up, hairpins, clips or nail polish. Do not wear lotions, powders, or perfumes.  Do not shave 48 hours prior to surgery. Men may shave face and neck. Do not bring valuables to the hospital.    [facility] is  not responsible for any belongings or valuables.  Contacts, dentures/partials or body piercings may not be worn into surgery. Bring a case for your contacts, glasses or hearing aids, a denture cup will be supplied. Leave your suitcase in the car. After surgery it may be brought to your room. For patients admitted to the hospital, discharge time is determined by your treatment team.   Patients discharged the day of surgery will not be allowed to drive home.   Please read over the following fact sheets that you were given:   MRSA Information  __X__ Take these medicines the morning of  surgery with A SIP OF WATER:    1. levothyroxine (SYNTHROID) 50 MCG tablet  2.   3.   4.  5.  6.  ____ Fleet Enema (as directed)   __X__ Use CHG Soap/SAGE wipes as directed  ____ Use inhalers on the day of surgery  ____ Stop metformin/Janumet/Farxiga 2 days prior to surgery    ____ Take 1/2 of usual insulin dose the night before surgery. No insulin the morning          of surgery.   ____ Stop Blood Thinners Coumadin/Plavix/Xarelto/Pleta/Pradaxa/Eliquis/Effient/Aspirin  on   Or contact your Surgeon, Cardiologist or Medical Doctor regarding  ability to stop your blood thinners  __X__ Stop Anti-inflammatories 7 days before surgery such as Advil, Ibuprofen, Motrin,  BC or Goodies Powder, Naprosyn, Naproxen, Aleve, Aspirin    __X__ Stop all herbal supplements, fish oil or vitamin E until after surgery.    ____ Bring C-Pap to the hospital.

## 2021-01-16 ENCOUNTER — Other Ambulatory Visit: Payer: Medicare Other

## 2021-01-16 ENCOUNTER — Encounter
Admission: RE | Admit: 2021-01-16 | Discharge: 2021-01-16 | Disposition: A | Discharge status: Home / Self Care | Payer: Medicare Other | Source: Ambulatory Visit | Attending: General Surgery | Admitting: General Surgery

## 2021-01-16 ENCOUNTER — Other Ambulatory Visit: Payer: Self-pay

## 2021-01-16 DIAGNOSIS — Z01818 Encounter for other preprocedural examination: Secondary | ICD-10-CM | POA: Insufficient documentation

## 2021-01-16 DIAGNOSIS — Z20822 Contact with and (suspected) exposure to covid-19: Secondary | ICD-10-CM | POA: Diagnosis not present

## 2021-01-16 LAB — SARS CORONAVIRUS 2 (TAT 6-24 HRS): SARS Coronavirus 2: NEGATIVE

## 2021-01-18 ENCOUNTER — Encounter
Admission: RE | Disposition: A | Discharge status: Home / Self Care | Payer: Self-pay | Source: Home / Self Care | Attending: General Surgery

## 2021-01-18 ENCOUNTER — Ambulatory Visit: Payer: Medicare Other | Admitting: Certified Registered"

## 2021-01-18 ENCOUNTER — Other Ambulatory Visit: Payer: Self-pay

## 2021-01-18 ENCOUNTER — Ambulatory Visit
Admission: RE | Admit: 2021-01-18 | Discharge: 2021-01-18 | Disposition: A | Discharge status: Home / Self Care | Payer: Medicare Other | Attending: General Surgery | Admitting: General Surgery

## 2021-01-18 DIAGNOSIS — Z791 Long term (current) use of non-steroidal anti-inflammatories (NSAID): Secondary | ICD-10-CM | POA: Insufficient documentation

## 2021-01-18 DIAGNOSIS — D225 Melanocytic nevi of trunk: Secondary | ICD-10-CM | POA: Diagnosis not present

## 2021-01-18 DIAGNOSIS — Z853 Personal history of malignant neoplasm of breast: Secondary | ICD-10-CM | POA: Diagnosis not present

## 2021-01-18 DIAGNOSIS — C7989 Secondary malignant neoplasm of other specified sites: Secondary | ICD-10-CM | POA: Diagnosis not present

## 2021-01-18 DIAGNOSIS — R222 Localized swelling, mass and lump, trunk: Secondary | ICD-10-CM | POA: Diagnosis present

## 2021-01-18 DIAGNOSIS — Z9221 Personal history of antineoplastic chemotherapy: Secondary | ICD-10-CM | POA: Insufficient documentation

## 2021-01-18 DIAGNOSIS — Z85828 Personal history of other malignant neoplasm of skin: Secondary | ICD-10-CM | POA: Insufficient documentation

## 2021-01-18 DIAGNOSIS — Z8542 Personal history of malignant neoplasm of other parts of uterus: Secondary | ICD-10-CM | POA: Insufficient documentation

## 2021-01-18 DIAGNOSIS — Z79899 Other long term (current) drug therapy: Secondary | ICD-10-CM | POA: Diagnosis not present

## 2021-01-18 DIAGNOSIS — Z882 Allergy status to sulfonamides status: Secondary | ICD-10-CM | POA: Diagnosis not present

## 2021-01-18 DIAGNOSIS — Z9013 Acquired absence of bilateral breasts and nipples: Secondary | ICD-10-CM | POA: Insufficient documentation

## 2021-01-18 HISTORY — PX: MASS EXCISION: SHX2000

## 2021-01-18 SURGERY — EXCISION MASS
Anesthesia: General | Laterality: Left

## 2021-01-18 MED ORDER — FAMOTIDINE 20 MG PO TABS
ORAL_TABLET | ORAL | Status: AC
  Administered 2021-01-18: 20 mg via ORAL
  Filled 2021-01-18: qty 1

## 2021-01-18 MED ORDER — FENTANYL CITRATE (PF) 100 MCG/2ML IJ SOLN
INTRAMUSCULAR | Status: DC | PRN
  Administered 2021-01-18 (×2): 25 ug via INTRAVENOUS

## 2021-01-18 MED ORDER — ONDANSETRON HCL 4 MG/2ML IJ SOLN
INTRAMUSCULAR | Status: DC | PRN
  Administered 2021-01-18: 4 mg via INTRAVENOUS

## 2021-01-18 MED ORDER — DEXAMETHASONE SODIUM PHOSPHATE 10 MG/ML IJ SOLN
INTRAMUSCULAR | Status: DC | PRN
  Administered 2021-01-18: 5 mg via INTRAVENOUS

## 2021-01-18 MED ORDER — CHLORHEXIDINE GLUCONATE 0.12 % MT SOLN: 15.0000 mL | Freq: Once | OROMUCOSAL | Status: AC

## 2021-01-18 MED ORDER — BUPIVACAINE HCL (PF) 0.5 % IJ SOLN: INTRAMUSCULAR | Status: DC | PRN

## 2021-01-18 MED ORDER — DEXAMETHASONE SODIUM PHOSPHATE 10 MG/ML IJ SOLN
INTRAMUSCULAR | Status: AC
  Filled 2021-01-18: qty 1

## 2021-01-18 MED ORDER — LACTATED RINGERS IV SOLN: INTRAVENOUS | Status: DC

## 2021-01-18 MED ORDER — CEFAZOLIN SODIUM-DEXTROSE 2-4 GM/100ML-% IV SOLN
2.0000 g | INTRAVENOUS | Status: AC
  Administered 2021-01-18: 2 g via INTRAVENOUS

## 2021-01-18 MED ORDER — CHLORHEXIDINE GLUCONATE CLOTH 2 % EX PADS: 6.0000 | MEDICATED_PAD | Freq: Once | CUTANEOUS | Status: DC

## 2021-01-18 MED ORDER — BUPIVACAINE HCL (PF) 0.5 % IJ SOLN
INTRAMUSCULAR | Status: AC
  Filled 2021-01-18: qty 30

## 2021-01-18 MED ORDER — ACETAMINOPHEN 10 MG/ML IV SOLN
INTRAVENOUS | Status: AC
  Filled 2021-01-18: qty 100

## 2021-01-18 MED ORDER — FAMOTIDINE 20 MG PO TABS: 20.0000 mg | ORAL_TABLET | Freq: Once | ORAL | Status: AC

## 2021-01-18 MED ORDER — ONDANSETRON HCL 4 MG/2ML IJ SOLN
INTRAMUSCULAR | Status: AC
  Filled 2021-01-18: qty 2

## 2021-01-18 MED ORDER — ONDANSETRON HCL 4 MG/2ML IJ SOLN: 4.0000 mg | Freq: Once | INTRAMUSCULAR | Status: DC | PRN

## 2021-01-18 MED ORDER — EPHEDRINE SULFATE 50 MG/ML IJ SOLN
INTRAMUSCULAR | Status: DC | PRN
  Administered 2021-01-18: 5 mg via INTRAVENOUS

## 2021-01-18 MED ORDER — BUPIVACAINE HCL (PF) 0.25 % IJ SOLN
INTRAMUSCULAR | Status: AC
  Filled 2021-01-18: qty 30

## 2021-01-18 MED ORDER — ORAL CARE MOUTH RINSE: 15.0000 mL | Freq: Once | OROMUCOSAL | Status: AC

## 2021-01-18 MED ORDER — FENTANYL CITRATE (PF) 100 MCG/2ML IJ SOLN: 25.0000 ug | INTRAMUSCULAR | Status: DC | PRN

## 2021-01-18 MED ORDER — BUPIVACAINE HCL (PF) 0.25 % IJ SOLN
INTRAMUSCULAR | Status: DC | PRN
  Administered 2021-01-18: 20 mL

## 2021-01-18 MED ORDER — CHLORHEXIDINE GLUCONATE 0.12 % MT SOLN
OROMUCOSAL | Status: AC
  Administered 2021-01-18: 15 mL via OROMUCOSAL
  Filled 2021-01-18: qty 15

## 2021-01-18 MED ORDER — BUPIVACAINE LIPOSOME 1.3 % IJ SUSP
INTRAMUSCULAR | Status: DC | PRN
  Administered 2021-01-18: 20 mL

## 2021-01-18 MED ORDER — ACETAMINOPHEN 10 MG/ML IV SOLN
INTRAVENOUS | Status: DC | PRN
  Administered 2021-01-18: 1000 mg via INTRAVENOUS

## 2021-01-18 MED ORDER — HYDROCODONE-ACETAMINOPHEN 5-325 MG PO TABS: 1.0000 | ORAL_TABLET | ORAL | 0 refills | Status: AC | PRN

## 2021-01-18 MED ORDER — PROPOFOL 10 MG/ML IV BOLUS
INTRAVENOUS | Status: DC | PRN
  Administered 2021-01-18: 100 mg via INTRAVENOUS

## 2021-01-18 MED ORDER — BUPIVACAINE LIPOSOME 1.3 % IJ SUSP
INTRAMUSCULAR | Status: AC
  Filled 2021-01-18: qty 20

## 2021-01-18 MED ORDER — FENTANYL CITRATE (PF) 100 MCG/2ML IJ SOLN
INTRAMUSCULAR | Status: AC
  Filled 2021-01-18: qty 2

## 2021-01-18 MED ORDER — CEFAZOLIN SODIUM-DEXTROSE 2-4 GM/100ML-% IV SOLN
INTRAVENOUS | Status: AC
  Filled 2021-01-18: qty 100

## 2021-01-18 SURGICAL SUPPLY — 36 items
APL PRP STRL LF DISP 70% ISPRP (MISCELLANEOUS) ×1
BINDER BREAST MEDIUM (GAUZE/BANDAGES/DRESSINGS) ×2 IMPLANT
BLADE CLIPPER SURG (BLADE) ×2 IMPLANT
BLADE SURG 15 STRL SS SAFETY (BLADE) ×2 IMPLANT
CHLORAPREP W/TINT 26 (MISCELLANEOUS) ×2 IMPLANT
COVER WAND RF STERILE (DRAPES) ×2 IMPLANT
DRAPE LAPAROTOMY TRNSV 106X77 (MISCELLANEOUS) ×2 IMPLANT
DRSG GAUZE FLUFF 36X18 (GAUZE/BANDAGES/DRESSINGS) ×4 IMPLANT
DRSG OPSITE POSTOP 4X8 (GAUZE/BANDAGES/DRESSINGS) ×2 IMPLANT
DRSG TEGADERM 4X4.75 (GAUZE/BANDAGES/DRESSINGS) ×2 IMPLANT
DRSG TELFA 3X8 NADH (GAUZE/BANDAGES/DRESSINGS) ×2 IMPLANT
ELECT REM PT RETURN 9FT ADLT (ELECTROSURGICAL) ×2
ELECTRODE REM PT RTRN 9FT ADLT (ELECTROSURGICAL) ×1 IMPLANT
GLOVE BIO SURGEON STRL SZ7.5 (GLOVE) ×2 IMPLANT
GLOVE INDICATOR 8.0 STRL GRN (GLOVE) ×2 IMPLANT
GOWN STRL REUS W/ TWL LRG LVL3 (GOWN DISPOSABLE) ×2 IMPLANT
GOWN STRL REUS W/TWL LRG LVL3 (GOWN DISPOSABLE) ×4
KIT TURNOVER KIT A (KITS) ×2 IMPLANT
LABEL OR SOLS (LABEL) ×2 IMPLANT
MANIFOLD NEPTUNE II (INSTRUMENTS) ×2 IMPLANT
MARGIN MAP 10MM (MISCELLANEOUS) ×2 IMPLANT
NEEDLE HYPO 25X1 1.5 SAFETY (NEEDLE) ×2 IMPLANT
NS IRRIG 500ML POUR BTL (IV SOLUTION) ×2 IMPLANT
PACK BASIN MINOR ARMC (MISCELLANEOUS) ×2 IMPLANT
SCALPEL PROTECTED #15 DISP (BLADE) ×2 IMPLANT
STAPLER SKIN PROX 35W (STAPLE) ×2 IMPLANT
STRIP CLOSURE SKIN 1/2X4 (GAUZE/BANDAGES/DRESSINGS) ×2 IMPLANT
SUT ETHILON 4 0 PS 2 18 (SUTURE) ×2 IMPLANT
SUT VIC AB 2-0 CT1 (SUTURE) ×4 IMPLANT
SUT VIC AB 3-0 54X BRD REEL (SUTURE) ×1 IMPLANT
SUT VIC AB 3-0 BRD 54 (SUTURE) ×2
SUT VIC AB 3-0 SH 27 (SUTURE) ×2
SUT VIC AB 3-0 SH 27X BRD (SUTURE) ×1 IMPLANT
SUT VIC AB 4-0 FS2 27 (SUTURE) ×2 IMPLANT
SWABSTK COMLB BENZOIN TINCTURE (MISCELLANEOUS) ×2 IMPLANT
SYR 10ML LL (SYRINGE) ×2 IMPLANT

## 2021-01-18 NOTE — Transfer of Care (Signed)
Immediate Anesthesia Transfer of Care Note  Patient: Cynthia [name]  Procedure(s) Performed: EXCISION MASS LEFT CHEST WALL (Left )  Patient Location: PACU  Anesthesia Type:General  Level of Consciousness: awake, alert  and oriented  Airway & Oxygen Therapy: Patient Spontanous Breathing and Patient connected to face mask oxygen  Post-op Assessment: Report given to RN and Post -op Vital signs reviewed and stable  Post vital signs: Reviewed and stable  Last Vitals:  Vitals Value Taken Time  BP 131/80 01/18/21 1250  Temp    Pulse 102 01/18/21 1252  Resp 9 01/18/21 1252  SpO2 100 % 01/18/21 1252  Vitals shown include unvalidated device data.  Last Pain:  Vitals:   01/18/21 1048  TempSrc: Oral  PainSc: 3          Complications: No complications documented.

## 2021-01-18 NOTE — Discharge Instructions (Signed)
AMBULATORY SURGERY  DISCHARGE INSTRUCTIONS   1) The drugs that you were given will stay in your system until tomorrow so for the next 24 hours you should not:  A) Drive an automobile B) Make any legal decisions C) Drink any alcoholic beverage   2) You may resume regular meals tomorrow.  Today it is better to start with liquids and gradually work up to solid foods.  You may eat anything you prefer, but it is better to start with liquids, then soup and crackers, and gradually work up to solid foods.   3) Please notify your doctor immediately if you have any unusual bleeding, trouble breathing, redness and pain at the surgery site, drainage, fever, or pain not relieved by medication.    4) Additional Instructions:        Please contact your physician with any problems or Same Day Surgery at [phone], Monday through Friday 6 am to 4 pm, or [place] [facility] number at [phone].  Excision of Skin Lesions, Care After This sheet gives you information about how to care for yourself after your procedure. Your health care provider may also give you more specific instructions. If you have problems or questions, contact your health care provider. What can I expect after the procedure? After your procedure, it is common to have pain or discomfort at the excision site. Follow these instructions at home: Excision care  Follow instructions from your health care provider about how to take care of your excision site. Make sure you: ? Wash your hands with soap and water before and after you change your bandage (dressing). If soap and water are not available, use hand sanitizer. ? Change your dressing as told by your health care provider. ? Leave stitches (sutures), skin glue, or adhesive strips in place. These skin closures may need to stay in place for 2 weeks or longer. If adhesive strip edges start to loosen and curl up, you may trim the loose edges. Do not remove adhesive  strips completely unless your health care provider tells you to do that.  Check the excision area every day for signs of infection. Watch for: ? Redness, swelling, or pain. ? Fluid or blood. ? Warmth. ? Pus or a bad smell.  Keep the site clean, dry, and protected for at least 48 hours.  For bleeding, apply gentle but firm pressure to the area using a folded towel for 20 minutes.  Avoid high-impact exercise and activities until the sutures are removed or the area heals.   General instructions  Take over-the-counter and prescription medicines only as told by your health care provider.  Follow instructions from your health care provider about how to minimize scarring. Scarring should lessen over time.  Avoid sun exposure until the area has healed. Use sunscreen to protect the area from the sun after it has healed.  Keep all follow-up visits as told by your health care provider. This is important. Contact a health care provider if:  You have redness, swelling, or pain around your excision site.  You have fluid or blood coming from your excision site.  Your excision site feels warm to the touch.  You have pus or a bad smell coming from your excision site.  You have a fever.  You have pain that does not improve in 2-3 days after your procedure.  You notice skin irregularities or changes in how you feel (sensation). Summary  This sheet of instructions provides you with information about caring for  yourself after your procedure. Contact your health care provider if you have any problems or questions.  Take over-the-counter and prescription medicines only as told by your health care provider.  Change your dressing as told by your health care provider.  Contact a health care provider if you have redness, swelling, pain, or other signs of infection around your excision site.  Keep all follow-up visits as told by your health care provider. This is important. This information is  not intended to replace advice given to you by your health care provider. Make sure you discuss any questions you have with your health care provider. Document Revised: 06/04/2018 Document Reviewed: 06/04/2018 Elsevier Patient Education  [place].  Bupivacaine Liposomal Suspension for Injection What is this medicine? BUPIVACAINE LIPOSOMAL (bue PIV a kane LIP oh som al) is an anesthetic. It causes loss of feeling in the skin or other tissues. It is used to prevent and to treat pain from some procedures. This medicine may be used for other purposes; ask your health care provider or pharmacist if you have questions. COMMON BRAND NAME(S): EXPAREL What should I tell my health care provider before I take this medicine? They need to know if you have any of these conditions:  G6PD deficiency  heart disease  kidney disease  liver disease  low blood pressure  lung or breathing disease, like asthma  an unusual or allergic reaction to bupivacaine, other medicines, foods, dyes, or preservatives  pregnant or trying to get pregnant  breast-feeding How should I use this medicine? This medicine is injected into the affected area. It is given by a health care provider in a hospital or clinic setting. Talk to your health care provider about the use of this medicine in children. While it may be given to children as young as 6 years for selected conditions, precautions do apply. Overdosage: If you think you have taken too much of this medicine contact a poison control center or emergency room at once. NOTE: This medicine is only for you. Do not share this medicine with others. What if I miss a dose? This does not apply. What may interact with this medicine? This medicine may interact with the following medications:  acetaminophen  certain antibiotics like dapsone, nitrofurantoin, aminosalicylic acid, sulfonamides  certain medicines for seizures like phenobarbital, phenytoin, valproic  acid  chloroquine  cyclophosphamide  flutamide  hydroxyurea  ifosfamide  metoclopramide  nitric oxide  nitroglycerin  nitroprusside  nitrous oxide  other local anesthetics like lidocaine, pramoxine, tetracaine  primaquine  quinine  rasburicase  sulfasalazine This list may not describe all possible interactions. Give your health care provider a list of all the medicines, herbs, non-prescription drugs, or dietary supplements you use. Also tell them if you smoke, drink alcohol, or use illegal drugs. Some items may interact with your medicine. What should I watch for while using this medicine? Your condition will be monitored carefully while you are receiving this medicine. Be careful to avoid injury while the area is numb, and you are not aware of pain. What side effects may I notice from receiving this medicine? Side effects that you should report to your doctor or health care professional as soon as possible:  allergic reactions like skin rash, itching or hives, swelling of the face, lips, or tongue  seizures  signs and symptoms of a dangerous change in heartbeat or heart rhythm like chest pain; dizziness; fast, irregular heartbeat; palpitations; feeling faint or lightheaded; falls; breathing problems  signs and symptoms  of methemoglobinemia such as pale, gray, or blue colored skin; headache; fast heartbeat; shortness of breath; feeling faint or lightheaded, falls; tiredness Side effects that usually do not require medical attention (report to your doctor or health care professional if they continue or are bothersome):  anxious  back pain  changes in taste  changes in vision  constipation  dizziness  fever  nausea, vomiting This list may not describe all possible side effects. Call your doctor for medical advice about side effects. You may report side effects to FDA at 1-800-FDA-1088. Where should I keep my medicine? This drug is given in a hospital or  clinic and will not be stored at home. NOTE: This sheet is a summary. It may not cover all possible information. If you have questions about this medicine, talk to your doctor, pharmacist, or health care provider.  2021 Elsevier/Gold Standard (2020-03-03 12:24:57)

## 2021-01-18 NOTE — Anesthesia Postprocedure Evaluation (Signed)
Anesthesia Post Note  Patient: Cynthia [name]  Procedure(s) Performed: EXCISION MASS LEFT CHEST WALL (Left )  Patient location during evaluation: PACU Anesthesia Type: General Level of consciousness: awake and alert Pain management: pain level controlled Vital Signs Assessment: post-procedure vital signs reviewed and stable Respiratory status: spontaneous breathing, nonlabored ventilation, respiratory function stable and patient connected to nasal cannula oxygen Cardiovascular status: blood pressure returned to baseline and stable Postop Assessment: no apparent nausea or vomiting Anesthetic complications: no   No complications documented.   Last Vitals:  Vitals:   01/18/21 1315 01/18/21 1340  BP: 133/73   Pulse: 91   Resp: 16   Temp: (!) 36.1 C 37.1 C  SpO2: 96%     Last Pain:  Vitals:   01/18/21 1340  TempSrc: Oral  PainSc:                  [name]

## 2021-01-18 NOTE — Op Note (Addendum)
Preoperative diagnosis: Left anterior chest wall mass consistent with breast cancer, status post bilateral mastectomy.  Postoperative diagnosis: Same.  Operative procedure: Excision of 4 x 5 cm left chest wall mass with intermediate repair, flap mobilization.  Operating surgeon: Hervey Ard, MD.  Anesthesia: General by LMA, Exparel: 20 cc; Marcaine 0.25% plain: 30 cc local infiltration.  Estimated blood loss 10 cc.  Clinical note: This 80 year old woman had undergone bilateral mastectomies over 20 years ago.  In the last few months she noticed a mass that was nontender on the left anterior chest overlying the intercostal space.  Subsequent biopsy by dermatology showed a breast cancer.  Imaging failed to show evidence of metastatic disease.  She is felt to be a candidate for primary excision.  The area measured 4 x 5 cm, vertically orientated.  This was approximately at the T4 intercostal space.  The patient had SCD stockings for DVT prevention.  She received Ancef prior to the procedure.  Operative note: With the patient under adequate general anesthesia the area was cleansed with ChloraPrep and draped.  Borders 1 cm outside the area of palpable tumor were outlined and then the above-mentioned local anesthetic was infiltrated approximately 3 cm outside this level and underneath the tumor mass for postoperative analgesia.  The skin was incised sharply with a vertically orientated elliptical incision that was 6 x 12 cm in size.  The skin was incised sharply and the remaining dissection completed with electrocautery.  Perforating branches from the intercostal artery were controlled with 3-0 Vicryl ties and 3-0 Vicryl suture ligatures.  The mass was orientated and sent in formalin for routine histology.  There did not appear to be involvement of the periosteum.  The fascia of the pectoralis muscle was taken with the specimen.  After the specimen had been removed flaps were elevated approximately 5-6  cm circumferentially, less so superiorly where it came up into the sternal notch.  The deep tissue including the pectoralis fascia on the right and the portion of the pectoralis muscle and the central portion on the left were approximated with interrupted 2-0 Vicryl figure-of-eight sutures.  The superficial adipose layer was approximated with another layer of 2-0 Vicryl subcuticular sutures.  The skin was closed with a running 4-0 Vicryl subcuticular suture.  The wound was then reinforced with staples. The wound measured 14 cm in length.   A 6 inch honeycomb gauze dressing was placed.  Fluff gauze and a compressive wrap were applied.    The patient tolerated the procedure well and was taken to the recovery in stable condition.

## 2021-01-18 NOTE — Anesthesia Preprocedure Evaluation (Signed)
Anesthesia Evaluation  Patient identified by MRN, date of birth, ID band Patient awake    Reviewed: Allergy & Precautions, H&P , NPO status , Patient's Chart, lab work & pertinent test results, reviewed documented beta blocker date and time   Airway Mallampati: II  TM Distance: >3 FB Neck ROM: full    Dental  (+) Teeth Intact   Pulmonary neg pulmonary ROS,    Pulmonary exam normal        Cardiovascular Exercise Tolerance: Good negative cardio ROS Normal cardiovascular exam Rate:Normal     Neuro/Psych negative neurological ROS  negative psych ROS   GI/Hepatic negative GI ROS, Neg liver ROS,   Endo/Other  Hypothyroidism   Renal/GU negative Renal ROS  negative genitourinary   Musculoskeletal   Abdominal   Peds  Hematology negative hematology ROS (+)   Anesthesia Other Findings   Reproductive/Obstetrics negative OB ROS                             Anesthesia Physical Anesthesia Plan  ASA: II  Anesthesia Plan: General LMA   Post-op Pain Management:    Induction:   PONV Risk Score and Plan: 3  Airway Management Planned:   Additional Equipment:   Intra-op Plan:   Post-operative Plan:   Informed Consent: I have reviewed the patients History and Physical, chart, labs and discussed the procedure including the risks, benefits and alternatives for the proposed anesthesia with the patient or authorized representative who has indicated his/her understanding and acceptance.       Plan Discussed with: CRNA  Anesthesia Plan Comments:         Anesthesia Quick Evaluation

## 2021-01-18 NOTE — H&P (Signed)
Cynthia [name] [id number] 1941-11-03     HPI:  80 y/o with left chest wall mass with biopsy showing breast cancer. S/P prior bilater mastectomy in the distant past.  Negative metastatic evaluation. For excision.   Medications Prior to Admission  Medication Sig Dispense Refill Last Dose  . alendronate (FOSAMAX) 70 MG tablet Take by mouth once a week. Take with a full glass of water. Do not lie down for the next 30 min Sunday   01/15/2021  . CALCIUM-VITAMIN D PO Take 1 tablet by mouth daily. 1200 mg Calcium 25 mcg Vit D   01/16/2021  . L-Theanine 200 MG CAPS Take 200 mg by mouth every evening.   Past Week at Unknown time  . levothyroxine (SYNTHROID) 50 MCG tablet Take 50 mcg by mouth daily before breakfast.   01/18/2021 at 0700  . meloxicam (MOBIC) 15 MG tablet Take 15 mg by mouth daily as needed for pain.   Past Month at Unknown time  . Multiple Vitamins-Minerals (PRESERVISION AREDS PO) Take 1 tablet by mouth daily.   01/17/2021 at Unknown time  . naproxen sodium (ALEVE) 220 MG tablet Take 220 mg by mouth daily as needed (Pain). Liquid gel   01/16/2021  . zolpidem (AMBIEN) 10 MG tablet Take 10 mg by mouth at bedtime.   01/16/2021   Allergies  Allergen Reactions  . Sulfa Antibiotics Nausea Only   Past Medical History:  Diagnosis Date  . Arthritis   . Breast cancer ([place])   . Hypothyroidism   . Insomnia   . Metastatic breast carcinoma ([place]) 10/26/2020    chest-medial  DERMAL CARCINOMA CONSISTENT WITH METASTATIC BREAST CARCINOMA,  . Osteoporosis   . Squamous cell carcinoma    RT ARM  . 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 ABDOMINAL HYSTERECTOMY  05/2011   BSO- RADICAL AND CHEMOTHERAPY COMPLETE   . MASTECTOMY     LEFT WITH AXILLARY NODE DISSECTION, RADICAL RIGHT PROPHYLATIC W/O AXILLARY DISSECTION  . PLACEMENT OF BREAST IMPLANTS     SILICONE RECONSTRUCTION   Social History   Socioeconomic History  . Marital status: Married    Spouse name: Not on file   . Number of children: Not on file  . Years of education: Not on file  . Highest education level: Not on file  Occupational History  . Not on file  Tobacco Use  . Smoking status: Never Smoker  . Smokeless tobacco: Never Used  Vaping Use  . Vaping Use: Never used  Substance and Sexual Activity  . Alcohol use: No    Alcohol/week: 0.0 standard drinks  . Drug use: No  . Sexual activity: Yes    Birth control/protection: Post-menopausal, Surgical  Other Topics Concern  . Not on file  Social History Narrative  . Not on file   Social Determinants of Health   Financial Resource Strain: Not on file  Food Insecurity: Not on file  Transportation Needs: Not on file  Physical Activity: Not on file  Stress: Not on file  Social Connections: Not on file  Intimate Partner Violence: Not on file   Social History   Social History Narrative  . Not on file     ROS: Negative.     PE: HEENT: Negative. Lungs: Clear. Cardio: RR.   Assessment/Plan:  Proceed with planned excision of left parasternal mass.    Forest Gleason [facility] 01/18/2021

## 2021-01-19 ENCOUNTER — Encounter: Payer: Self-pay | Admitting: General Surgery

## 2021-01-20 ENCOUNTER — Telehealth: Payer: Self-pay

## 2021-01-20 NOTE — Telephone Encounter (Signed)
Working on it.

## 2021-01-20 NOTE — Telephone Encounter (Signed)
Please schedule as MD recommends. ?

## 2021-01-20 NOTE — Telephone Encounter (Signed)
-----   Message from Earlie Server, MD sent at 01/19/2021  9:17 PM EST ----- Thank you Dr.Byrnett. will coordinate.    Team please arrange her to see me in 1 week to go over post surgery plans. MD only. Thanks.   ----- Message ----- From: Robert Bellow, MD Sent: 01/19/2021   4:42 PM EST To: Earlie Server, MD  Patient notified of results.  Minimal pain post Exparel injection. Would plan for RT after healing.

## 2021-01-20 NOTE — Telephone Encounter (Signed)
Done pt has been sched as requested. Pt stated that she only come on 01/26/21 Appt sched

## 2021-01-25 ENCOUNTER — Other Ambulatory Visit: Payer: Self-pay | Admitting: General Surgery

## 2021-01-25 DIAGNOSIS — C50919 Malignant neoplasm of unspecified site of unspecified female breast: Secondary | ICD-10-CM

## 2021-01-26 ENCOUNTER — Telehealth: Payer: Self-pay

## 2021-01-26 ENCOUNTER — Encounter: Payer: Self-pay | Admitting: Oncology

## 2021-01-26 ENCOUNTER — Inpatient Hospital Stay: Payer: Medicare Other

## 2021-01-26 ENCOUNTER — Inpatient Hospital Stay: Payer: Medicare Other | Attending: Oncology | Admitting: Oncology

## 2021-01-26 VITALS — BP 116/85 | HR 85 | Temp 98.0°F | Resp 18 | Wt 122.0 lb

## 2021-01-26 DIAGNOSIS — C50919 Malignant neoplasm of unspecified site of unspecified female breast: Secondary | ICD-10-CM

## 2021-01-26 DIAGNOSIS — C7989 Secondary malignant neoplasm of other specified sites: Secondary | ICD-10-CM | POA: Insufficient documentation

## 2021-01-26 DIAGNOSIS — Z79899 Other long term (current) drug therapy: Secondary | ICD-10-CM | POA: Diagnosis not present

## 2021-01-26 DIAGNOSIS — M810 Age-related osteoporosis without current pathological fracture: Secondary | ICD-10-CM | POA: Insufficient documentation

## 2021-01-26 DIAGNOSIS — Z7189 Other specified counseling: Secondary | ICD-10-CM | POA: Insufficient documentation

## 2021-01-26 DIAGNOSIS — M129 Arthropathy, unspecified: Secondary | ICD-10-CM | POA: Diagnosis not present

## 2021-01-26 DIAGNOSIS — L03011 Cellulitis of right finger: Secondary | ICD-10-CM | POA: Insufficient documentation

## 2021-01-26 DIAGNOSIS — C50912 Malignant neoplasm of unspecified site of left female breast: Secondary | ICD-10-CM | POA: Diagnosis not present

## 2021-01-26 DIAGNOSIS — Z170 Estrogen receptor positive status [ER+]: Secondary | ICD-10-CM | POA: Diagnosis not present

## 2021-01-26 DIAGNOSIS — E039 Hypothyroidism, unspecified: Secondary | ICD-10-CM | POA: Insufficient documentation

## 2021-01-26 LAB — URIC ACID: Uric Acid, Serum: 4.6 mg/dL (ref 2.5–7.1)

## 2021-01-26 MED ORDER — CEPHALEXIN 500 MG PO CAPS: 500.0000 mg | ORAL_CAPSULE | Freq: Four times a day (QID) | ORAL | 0 refills | Status: DC

## 2021-01-26 NOTE — Telephone Encounter (Signed)
Dental clearance request letter faxed to Dr. Quentin Mulling office.

## 2021-01-26 NOTE — Progress Notes (Signed)
Pt here for follow up. No new concerns and no new breast problems.

## 2021-01-26 NOTE — Progress Notes (Signed)
Hematology/Oncology progress note [facility] [phone] [phone]   Patient Care Team: [name], MD as PCP - General (Family Medicine)  REFERRING PROVIDER: [name], MD  CHIEF COMPLAINTS/REASON FOR VISIT:  metastatic breast cancer  HISTORY OF PRESENTING ILLNESS:   Cynthia [name] is a  80 y.o.  female with PMH listed below was seen in consultation at the request of  [name], MD  for evaluation of metastatic breast cancer Patient was previously seen by Dr. Jeb Levering last seen in 2017. 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 Clarene Essex, Norma Fredrickson platinum, Taxol finished in November 2012, also status post radiation finished in October 2012.  More remotely she has a history of breast cancer in 1987/1988, status post bilateral mastectomy followed by tamoxifen for 5 years.  Patient has noticed an area on her anterior chest wall for couple of months.  It is hard, nontender.  She recently was seen by dermatologist and had a biopsy of the site on 10/26/2020 Biopsy showed dermal carcinoma consistent with metastatic breast carcinoma.  ER 90%, PR 90%, HER-2 IHC equivocal, FISH negative.  Patient reports feeling well otherwise.  Denies any new bone pain.  She has chronic aches and pains, especially bilateral lower extremities.  Appetite is fair.  No unintentional weight loss  INTERVAL HISTORY Cynthia [name] is a 80 y.o. female who has above history reviewed by me today presents for follow up visit for management of breast cancer local chest wall recurrence Problems and complaints are listed below: 01/18/2021 patient has had chest wall mass resection by Dr. Bary Castilla Patient presents to discuss pathology results and management plan. She reports noticing right ring finger interphalangeal joint pain and redness. She does not recall any recent trauma.  No fever chills.  Denies any  history of gout.   Review of Systems  Constitutional: Negative for appetite change, chills, fatigue and fever.  HENT:   Negative for hearing loss and voice change.   Eyes: Negative for eye problems.  Respiratory: Negative for chest tightness and cough.   Cardiovascular: Negative for chest pain.  Gastrointestinal: Negative for abdominal distention, abdominal pain and blood in stool.  Endocrine: Negative for hot flashes.  Genitourinary: Negative for difficulty urinating and frequency.   Musculoskeletal: Negative for arthralgias.  Skin: Negative for itching and rash.       Anterior chest wall skin lesion status post biopsy  Neurological: Negative for extremity weakness.  Hematological: Negative for adenopathy.  Psychiatric/Behavioral: Negative for confusion.    MEDICAL HISTORY:  Past Medical History:  Diagnosis Date  . Arthritis   . Breast cancer ([place])   . Hypothyroidism   . Insomnia   . Metastatic breast carcinoma ([place]) 10/26/2020    chest-medial  DERMAL CARCINOMA CONSISTENT WITH METASTATIC BREAST CARCINOMA,  . Osteoporosis   . Squamous cell carcinoma    RT ARM  . 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 ABDOMINAL HYSTERECTOMY  05/2011   BSO- RADICAL AND CHEMOTHERAPY COMPLETE   . MASS EXCISION Left 01/18/2021   Procedure: EXCISION MASS LEFT CHEST WALL;  Surgeon: Robert Bellow, MD;  Location: [facility] ORS;  Service: General;  Laterality: Left;  left chest wall breast cancer  . MASTECTOMY     LEFT WITH AXILLARY NODE DISSECTION, RADICAL RIGHT PROPHYLATIC W/O AXILLARY DISSECTION  . PLACEMENT OF BREAST IMPLANTS  SILICONE RECONSTRUCTION    SOCIAL HISTORY: Social History   Socioeconomic History  . Marital status: Married    Spouse name: Not on file  . Number of children: Not on file  . Years of education: Not on file  . Highest education level: Not on file  Occupational History  . Not on file  Tobacco Use  .  Smoking status: Never Smoker  . Smokeless tobacco: Never Used  Vaping Use  . Vaping Use: Never used  Substance and Sexual Activity  . Alcohol use: No    Alcohol/week: 0.0 standard drinks  . Drug use: No  . Sexual activity: Yes    Birth control/protection: Post-menopausal, Surgical  Other Topics Concern  . Not on file  Social History Narrative  . Not on file   Social Determinants of Health   Financial Resource Strain: Not on file  Food Insecurity: Not on file  Transportation Needs: Not on file  Physical Activity: Not on file  Stress: Not on file  Social Connections: Not on file  Intimate Partner Violence: Not on file    FAMILY HISTORY: Family History  Problem Relation Age of Onset  . Osteoporosis Mother   . Dementia Mother   . Hypertension Father   . Heart attack Father   . Alzheimer's disease Brother   . Diabetes Neg Hx   . Cancer Neg Hx     ALLERGIES:  is allergic to sulfa antibiotics.  MEDICATIONS:  Current Outpatient Medications  Medication Sig Dispense Refill  . alendronate (FOSAMAX) 70 MG tablet Take by mouth once a week. Take with a full glass of water. Do not lie down for the next 30 min Sunday    . CALCIUM-VITAMIN D PO Take 1 tablet by mouth daily. 1200 mg Calcium 25 mcg Vit D    . L-Theanine 200 MG CAPS Take 200 mg by mouth every evening.    Marland Kitchen levothyroxine (SYNTHROID) 50 MCG tablet Take 50 mcg by mouth daily before breakfast.    . meloxicam (MOBIC) 15 MG tablet Take 15 mg by mouth daily as needed for pain.    . Multiple Vitamins-Minerals (PRESERVISION AREDS PO) Take 1 tablet by mouth daily.    . naproxen sodium (ALEVE) 220 MG tablet Take 220 mg by mouth daily as needed (Pain). Liquid gel    . zolpidem (AMBIEN) 10 MG tablet Take 10 mg by mouth at bedtime.    Marland Kitchen HYDROcodone-acetaminophen (NORCO/VICODIN) 5-325 MG tablet Take 1 tablet by mouth every 4 (four) hours as needed for moderate pain. (Patient not taking: Reported on 01/26/2021) 12 tablet 0   No  current facility-administered medications for this visit.     PHYSICAL EXAMINATION: ECOG PERFORMANCE STATUS: 0 - Asymptomatic Vitals:   01/26/21 0923  BP: 116/85  Pulse: 85  Resp: 18  Temp: 98 F (36.7 C)   Filed Weights   01/26/21 0923  Weight: 122 lb (55.3 kg)    Physical Exam Constitutional:      General: She is not in acute distress. HENT:     Head: Normocephalic and atraumatic.  Eyes:     General: No scleral icterus. Cardiovascular:     Rate and Rhythm: Normal rate and regular rhythm.     Heart sounds: Normal heart sounds.  Pulmonary:     Effort: Pulmonary effort is normal. No respiratory distress.     Breath sounds: No wheezing.  Abdominal:     General: Bowel sounds are normal. There is no distension.     Palpations:  Abdomen is soft.  Musculoskeletal:        General: No deformity. Normal range of motion.     Cervical back: Normal range of motion and neck supple.  Skin:    General: Skin is warm and dry.     Findings: No erythema or rash.  Neurological:     Mental Status: She is alert and oriented to person, place, and time. Mental status is at baseline.     Cranial Nerves: No cranial nerve deficit.     Coordination: Coordination normal.  Psychiatric:        Mood and Affect: Mood normal.       LABORATORY DATA:  I have reviewed the data as listed Lab Results  Component Value Date   WBC 5.5 06/21/2017   HGB 13.7 06/21/2017   HCT 40.4 06/21/2017   MCV 92.5 06/21/2017   PLT 259 06/21/2017   No results for input(s): NA, K, CL, CO2, GLUCOSE, BUN, CREATININE, CALCIUM, GFRNONAA, GFRAA, PROT, ALBUMIN, AST, ALT, ALKPHOS, BILITOT, BILIDIR, IBILI in the last 8760 hours. Iron/TIBC/Ferritin/ %Sat No results found for: IRON, TIBC, FERRITIN, IRONPCTSAT    RADIOGRAPHIC STUDIES: I have personally reviewed the radiological images as listed and agreed with the findings in the report. MR Brain W Wo Contrast  Result Date: 01/02/2021 CLINICAL DATA:  Breast cancer  recurrence.  Staging. EXAM: MRI HEAD WITHOUT AND WITH CONTRAST TECHNIQUE: Multiplanar, multiecho pulse sequences of the brain and surrounding structures were obtained without and with intravenous contrast. CONTRAST:  68m GADAVIST GADOBUTROL 1 MMOL/ML IV SOLN COMPARISON:  None. FINDINGS: Brain: No acute infarction, hemorrhage, hydrocephalus, extra-axial collection or mass lesion. Small amount of scattered foci of T2 hyperintensity are within the white matter of the cerebral hemispheres nonspecific. Moderate parenchymal volume loss. No focus of abnormal contrast enhancement. Vascular: Normal flow voids. Skull and upper cervical spine: Normal marrow signal. Sinuses/Orbits: Bilateral lens surgery. Paranasal sinuses are essentially clear. IMPRESSION: 1. No evidence of intracranial metastatic disease. 2. Small amount of scattered foci of T2 hyperintensity within the white matter of the cerebral hemispheres nonspecific but may represent mild chronic microvascular ischemic changes. 3. Moderate parenchymal volume loss. Electronically Signed   By: KPedro EarlsM.D.   On: 01/02/2021 10:46      ASSESSMENT & PLAN:  1. Metastatic breast cancer (HCC)   2. Paronychia of finger of right hand   3. Osteoporosis without current pathological fracture, unspecified osteoporosis type   Cancer Staging Metastatic breast cancer ([facility] Staging form: Breast, AJCC 8th Edition - Clinical: Stage IV (cTX, cNX, cM1, ER+, PR+, HER2-) - Signed by YEarlie Server MD on 01/26/2021   #stage IV breast cancer-local chest wall recurrence, ER/PR 95%, HER-2 negative. Status post chest wall resection. Pathology was reviewed and discussed with patient. Consistent with metastatic carcinoma, morphologically compatible with the patient's reported history of breast carcinoma. Posterior lateral margin is involved by tumor.  Intradermal nevus benign. ER/PR/HER-2 for the specimen will be reported in an addendum. Follow-up with surgery for  staple removal. I recommend patient to establish care with radiation oncology for radiation.  I discussed with her about systemic antiestrogen treatment. Given that she has a local recurrence which has now been resected, I will reserve CDK 4/5 behavior treatments for the future if she develops systemic metastasis. Osteoporosis on oral bisphosphonate alendronate.  Discussed about the rationale of aromatase inhibitor and potential side effects.  She has been evaluated by dentist.  Awaiting dental clearance.  Options of IV bisphosphonate was discussed  with patient and the patient is interested in the switching from oral bisphosphonate to IV Zometa 4 mg every 6 months. 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  Right fourth finger paronychia.  Recommend topical antibiotic ointment.  Patient prefers to be started on empiric oral antibiotics.  I sent patient a prescription of Keflex 500 mg 4 times daily for 5 days.  Orders Placed This Encounter  Procedures  . Uric acid    Standing Status:   Future    Number of Occurrences:   1    Standing Expiration Date:   01/26/2022    All questions were answered. The patient knows to call the clinic with any problems questions or concerns.  cc [name], MD    Return of visit: 1 week after radiation.  Earlie Server, MD, PhD Hematology Oncology [facility] Pager- [phone] 01/26/2021

## 2021-01-30 NOTE — Telephone Encounter (Signed)
Dental clearance received and scanned in media.  

## 2021-02-01 ENCOUNTER — Encounter: Payer: Self-pay | Admitting: Radiation Oncology

## 2021-02-02 ENCOUNTER — Ambulatory Visit
Admission: RE | Admit: 2021-02-02 | Discharge: 2021-02-02 | Disposition: A | Discharge status: Home / Self Care | Payer: Medicare Other | Source: Ambulatory Visit | Attending: Radiation Oncology | Admitting: Radiation Oncology

## 2021-02-02 ENCOUNTER — Encounter: Payer: Self-pay | Admitting: Radiation Oncology

## 2021-02-02 VITALS — BP 142/89 | HR 83 | Temp 97.4°F | Resp 16 | Wt 122.1 lb

## 2021-02-02 DIAGNOSIS — C50919 Malignant neoplasm of unspecified site of unspecified female breast: Secondary | ICD-10-CM

## 2021-02-02 NOTE — Consult Note (Signed)
NEW PATIENT EVALUATION  Name: Cynthia [name]  MRN: [id number]  Date:   02/02/2021     DOB: 1941/12/06   This 80 y.o. 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  REFERRING PHYSICIAN: [name], MD  CHIEF COMPLAINT:  Chief Complaint  Patient presents with  . Metastatic breast cancer    DIAGNOSIS: The encounter diagnosis was Metastatic breast cancer ([place]).   PREVIOUS INVESTIGATIONS:  Patient is a 80 year old female treated back in 1988 status post bilateral mastectomies followed by tamoxifen for 5 years.  She had chest wall radiation.  She also has a history of uterine carcinoma diagnosed in 2000 status post surgery at [facility] followed by carboplatinum.  She recently presented with a mass in the anterior left chest wall biopsy was positive for dermal carcinoma consistent with metastatic breast cancer ER/PR positive HER-2/neu equivocal.  FISH was negative.  She had surgical incision excision by Dr. Tollie Pizza showing metastatic carcinoma morphologically compatible with the patient's reported history of breast cancer.  Posterior lateral margin was involved by tumor.  She is doing well at the present time.  She specifically denies any chest chest wall tenderness cough or bone pain.  As part of her work-up she had a PET CT scan which I have reviewed showing hypermetabolic left paracentral chest wall mass consistent with known recurrent breast cancer no other sites of metastatic disease.  MRI of the brain showing no evidence of intracranial metastatic disease.  HPI: As above  PLANNED TREATMENT REGIMEN: Electron-beam therapy to left chest wall  PAST MEDICAL HISTORY:  has a past medical history of Arthritis, Breast cancer ([place]), Hypothyroidism, Insomnia, Metastatic breast carcinoma ([place]) (10/26/2020), Osteoporosis, Squamous cell carcinoma, and  Uterine cancer ([place]).    PAST SURGICAL HISTORY:  Past Surgical History:  Procedure Laterality Date  . ABDOMINAL HYSTERECTOMY  05/2011   BSO- RADICAL AND CHEMOTHERAPY COMPLETE   . MASS EXCISION Left 01/18/2021   Procedure: EXCISION MASS LEFT CHEST WALL;  Surgeon: Robert Bellow, MD;  Location: [facility] ORS;  Service: General;  Laterality: Left;  left chest wall breast cancer  . MASTECTOMY     LEFT WITH AXILLARY NODE DISSECTION, RADICAL RIGHT PROPHYLATIC W/O AXILLARY DISSECTION  . PLACEMENT OF BREAST IMPLANTS     SILICONE RECONSTRUCTION    FAMILY HISTORY: family history includes Alzheimer's disease in her brother; Dementia in her mother; Heart attack in her father; Hypertension in her father; Osteoporosis in her mother.  SOCIAL HISTORY:  reports that she has never smoked. She has never used smokeless tobacco. She reports that she does not drink alcohol and does not use drugs.  ALLERGIES: Sulfa antibiotics  MEDICATIONS:  Current Outpatient Medications  Medication Sig Dispense Refill  . alendronate (FOSAMAX) 70 MG tablet Take by mouth once a week. Take with a full glass of water. Do not lie down for the next 30 min Sunday    . CALCIUM-VITAMIN D PO Take 1 tablet by mouth daily. 1200 mg Calcium 25 mcg Vit D    . HYDROcodone-acetaminophen (NORCO/VICODIN) 5-325 MG tablet Take 1 tablet by mouth every 4 (four) hours as needed for moderate pain. 12 tablet 0  . L-Theanine 200 MG CAPS Take 200 mg by mouth every evening.    Marland Kitchen levothyroxine (SYNTHROID) 50 MCG tablet Take 50 mcg by mouth daily before breakfast.    . meloxicam (MOBIC) 15 MG tablet  Take 15 mg by mouth daily as needed for pain.    . Multiple Vitamins-Minerals (PRESERVISION AREDS PO) Take 1 tablet by mouth daily.    . naproxen sodium (ALEVE) 220 MG tablet Take 220 mg by mouth daily as needed (Pain). Liquid gel    . zolpidem (AMBIEN) 10 MG tablet Take 10 mg by mouth at bedtime.     No current facility-administered medications for this  encounter.    ECOG PERFORMANCE STATUS:  0 - Asymptomatic  REVIEW OF SYSTEMS: Patient denies any weight loss, fatigue, weakness, fever, chills or night sweats. Patient denies any loss of vision, blurred vision. Patient denies any ringing  of the ears or hearing loss. No irregular heartbeat. Patient denies heart murmur or history of fainting. Patient denies any chest pain or pain radiating to her upper extremities. Patient denies any shortness of breath, difficulty breathing at night, cough or hemoptysis. Patient denies any swelling in the lower legs. Patient denies any nausea vomiting, vomiting of blood, or coffee ground material in the vomitus. Patient denies any stomach pain. Patient states has had normal bowel movements no significant constipation or diarrhea. Patient denies any dysuria, hematuria or significant nocturia. Patient denies any problems walking, swelling in the joints or loss of balance. Patient denies any skin changes, loss of hair or loss of weight. Patient denies any excessive worrying or anxiety or significant depression. Patient denies any problems with insomnia. Patient denies excessive thirst, polyuria, polydipsia. Patient denies any swollen glands, patient denies easy bruising or easy bleeding. Patient denies any recent infections, allergies or URI. Patient "s visual fields have not changed significantly in recent time.   PHYSICAL EXAM: BP (!) 142/89 (BP Location: Right Arm, Patient Position: Sitting, Cuff Size: Normal)   Pulse 83   Temp (!) 97.4 F (36.3 C) (Tympanic)   Resp 16   Wt 122 lb 1.6 oz (55.4 kg)   BMI 20.96 kg/m  She has an area of excision on the medial part of the left chest wall.  It is healing well.  Steri-Strips are still present.  No evidence of mass or nodularity is noted.  No axillary or supraclavicular adenopathy is appreciated well-developed well-nourished patient in NAD. HEENT reveals PERLA, EOMI, discs not visualized.  Oral cavity is clear. No oral  mucosal lesions are identified. Neck is clear without evidence of cervical or supraclavicular adenopathy. Lungs are clear to A&P. Cardiac examination is essentially unremarkable with regular rate and rhythm without murmur rub or thrill. Abdomen is benign with no organomegaly or masses noted. Motor sensory and DTR levels are equal and symmetric in the upper and lower extremities. Cranial nerves II through XII are grossly intact. Proprioception is intact. No peripheral adenopathy or edema is identified. No motor or sensory levels are noted. Crude visual fields are within normal range.  LABORATORY DATA: Pathology report reviewed    RADIOLOGY RESULTS: Brain MRI and PET CT scan both reviewed compatible with above-stated findings   IMPRESSION: Chest wall recurrence in 80 year old female status post remote mastectomy and chest wall radiation back in the 80s PLAN: At this time elect go ahead with electron-beam therapy to the area of recurrence.  We will use PET CT to document the actual area of involvement.  Would plan on delivering 60 Gray over 6 weeks using electron beam.  Risks and benefits of treatment including skin reaction fatigue alteration of blood counts possible more severe skin reaction since is possibly area of previous radiation all were discussed in detail with the  patient she seems to comprehend my treatment plan well.  I have personally set up and ordered CT simulation for next week.  I would like to take this opportunity to thank you for allowing me to participate in the care of your patient.Noreene Filbert, MD

## 2021-02-03 ENCOUNTER — Encounter: Payer: Self-pay | Admitting: Oncology

## 2021-02-03 ENCOUNTER — Other Ambulatory Visit: Payer: Self-pay | Admitting: Oncology

## 2021-02-03 LAB — SURGICAL PATHOLOGY

## 2021-02-07 ENCOUNTER — Ambulatory Visit: Payer: Medicare Other | Admitting: Dermatology

## 2021-02-08 ENCOUNTER — Ambulatory Visit
Admission: RE | Admit: 2021-02-08 | Discharge: 2021-02-08 | Disposition: A | Discharge status: Home / Self Care | Payer: Medicare Other | Source: Ambulatory Visit | Attending: Radiation Oncology | Admitting: Radiation Oncology

## 2021-02-08 DIAGNOSIS — Z510 Encounter for antineoplastic radiation therapy: Secondary | ICD-10-CM | POA: Diagnosis not present

## 2021-02-08 DIAGNOSIS — C792 Secondary malignant neoplasm of skin: Secondary | ICD-10-CM | POA: Insufficient documentation

## 2021-02-14 DIAGNOSIS — C792 Secondary malignant neoplasm of skin: Secondary | ICD-10-CM | POA: Diagnosis not present

## 2021-02-15 ENCOUNTER — Ambulatory Visit: Payer: Medicare Other

## 2021-02-16 ENCOUNTER — Ambulatory Visit: Payer: Medicare Other

## 2021-02-17 ENCOUNTER — Ambulatory Visit: Payer: Medicare Other

## 2021-02-20 ENCOUNTER — Ambulatory Visit
Admission: RE | Admit: 2021-02-20 | Discharge: 2021-02-20 | Disposition: A | Discharge status: Home / Self Care | Payer: Medicare Other | Source: Ambulatory Visit | Attending: Radiation Oncology | Admitting: Radiation Oncology

## 2021-02-20 DIAGNOSIS — C792 Secondary malignant neoplasm of skin: Secondary | ICD-10-CM | POA: Diagnosis not present

## 2021-02-21 ENCOUNTER — Ambulatory Visit
Admission: RE | Admit: 2021-02-21 | Discharge: 2021-02-21 | Disposition: A | Discharge status: Home / Self Care | Payer: Medicare Other | Source: Ambulatory Visit | Attending: Radiation Oncology | Admitting: Radiation Oncology

## 2021-02-21 DIAGNOSIS — C792 Secondary malignant neoplasm of skin: Secondary | ICD-10-CM | POA: Diagnosis not present

## 2021-02-22 ENCOUNTER — Ambulatory Visit
Admission: RE | Admit: 2021-02-22 | Discharge: 2021-02-22 | Disposition: A | Discharge status: Home / Self Care | Payer: Medicare Other | Source: Ambulatory Visit | Attending: Radiation Oncology | Admitting: Radiation Oncology

## 2021-02-22 DIAGNOSIS — C792 Secondary malignant neoplasm of skin: Secondary | ICD-10-CM | POA: Diagnosis not present

## 2021-02-23 ENCOUNTER — Ambulatory Visit
Admission: RE | Admit: 2021-02-23 | Discharge: 2021-02-23 | Disposition: A | Discharge status: Home / Self Care | Payer: Medicare Other | Source: Ambulatory Visit | Attending: Radiation Oncology | Admitting: Radiation Oncology

## 2021-02-23 DIAGNOSIS — C792 Secondary malignant neoplasm of skin: Secondary | ICD-10-CM | POA: Diagnosis not present

## 2021-02-24 ENCOUNTER — Ambulatory Visit
Admission: RE | Admit: 2021-02-24 | Discharge: 2021-02-24 | Disposition: A | Discharge status: Home / Self Care | Payer: Medicare Other | Source: Ambulatory Visit | Attending: Radiation Oncology | Admitting: Radiation Oncology

## 2021-02-24 DIAGNOSIS — C792 Secondary malignant neoplasm of skin: Secondary | ICD-10-CM | POA: Diagnosis not present

## 2021-02-27 ENCOUNTER — Ambulatory Visit
Admission: RE | Admit: 2021-02-27 | Discharge: 2021-02-27 | Disposition: A | Discharge status: Home / Self Care | Payer: Medicare Other | Source: Ambulatory Visit | Attending: Radiation Oncology | Admitting: Radiation Oncology

## 2021-02-27 DIAGNOSIS — C792 Secondary malignant neoplasm of skin: Secondary | ICD-10-CM | POA: Diagnosis not present

## 2021-02-28 ENCOUNTER — Ambulatory Visit
Admission: RE | Admit: 2021-02-28 | Discharge: 2021-02-28 | Disposition: A | Discharge status: Home / Self Care | Payer: Medicare Other | Source: Ambulatory Visit | Attending: Radiation Oncology | Admitting: Radiation Oncology

## 2021-02-28 DIAGNOSIS — C792 Secondary malignant neoplasm of skin: Secondary | ICD-10-CM | POA: Diagnosis not present

## 2021-03-01 ENCOUNTER — Ambulatory Visit: Payer: Medicare Other

## 2021-03-02 ENCOUNTER — Ambulatory Visit
Admission: RE | Admit: 2021-03-02 | Discharge: 2021-03-02 | Disposition: A | Discharge status: Home / Self Care | Payer: Medicare Other | Source: Ambulatory Visit | Attending: Radiation Oncology | Admitting: Radiation Oncology

## 2021-03-02 DIAGNOSIS — C792 Secondary malignant neoplasm of skin: Secondary | ICD-10-CM | POA: Diagnosis not present

## 2021-03-03 ENCOUNTER — Ambulatory Visit
Admission: RE | Admit: 2021-03-03 | Discharge: 2021-03-03 | Disposition: A | Discharge status: Home / Self Care | Payer: Medicare Other | Source: Ambulatory Visit | Attending: Radiation Oncology | Admitting: Radiation Oncology

## 2021-03-03 DIAGNOSIS — C792 Secondary malignant neoplasm of skin: Secondary | ICD-10-CM | POA: Diagnosis not present

## 2021-03-06 ENCOUNTER — Ambulatory Visit
Admission: RE | Admit: 2021-03-06 | Discharge: 2021-03-06 | Disposition: A | Discharge status: Home / Self Care | Payer: Medicare Other | Source: Ambulatory Visit | Attending: Radiation Oncology | Admitting: Radiation Oncology

## 2021-03-06 DIAGNOSIS — C792 Secondary malignant neoplasm of skin: Secondary | ICD-10-CM | POA: Diagnosis not present

## 2021-03-07 ENCOUNTER — Ambulatory Visit
Admission: RE | Admit: 2021-03-07 | Discharge: 2021-03-07 | Disposition: A | Discharge status: Home / Self Care | Payer: Medicare Other | Source: Ambulatory Visit | Attending: Radiation Oncology | Admitting: Radiation Oncology

## 2021-03-07 DIAGNOSIS — C792 Secondary malignant neoplasm of skin: Secondary | ICD-10-CM | POA: Diagnosis not present

## 2021-03-08 ENCOUNTER — Ambulatory Visit
Admission: RE | Admit: 2021-03-08 | Discharge: 2021-03-08 | Disposition: A | Discharge status: Home / Self Care | Payer: Medicare Other | Source: Ambulatory Visit | Attending: Radiation Oncology | Admitting: Radiation Oncology

## 2021-03-08 DIAGNOSIS — C792 Secondary malignant neoplasm of skin: Secondary | ICD-10-CM | POA: Diagnosis not present

## 2021-03-09 ENCOUNTER — Ambulatory Visit
Admission: RE | Admit: 2021-03-09 | Discharge: 2021-03-09 | Disposition: A | Discharge status: Home / Self Care | Payer: Medicare Other | Source: Ambulatory Visit | Attending: Radiation Oncology | Admitting: Radiation Oncology

## 2021-03-09 DIAGNOSIS — C792 Secondary malignant neoplasm of skin: Secondary | ICD-10-CM | POA: Diagnosis not present

## 2021-03-10 ENCOUNTER — Ambulatory Visit
Admission: RE | Admit: 2021-03-10 | Discharge: 2021-03-10 | Disposition: A | Discharge status: Home / Self Care | Payer: Medicare Other | Source: Ambulatory Visit | Attending: Radiation Oncology | Admitting: Radiation Oncology

## 2021-03-10 DIAGNOSIS — Z510 Encounter for antineoplastic radiation therapy: Secondary | ICD-10-CM | POA: Diagnosis not present

## 2021-03-10 DIAGNOSIS — C792 Secondary malignant neoplasm of skin: Secondary | ICD-10-CM | POA: Insufficient documentation

## 2021-03-13 ENCOUNTER — Ambulatory Visit
Admission: RE | Admit: 2021-03-13 | Discharge: 2021-03-13 | Disposition: A | Discharge status: Home / Self Care | Payer: Medicare Other | Source: Ambulatory Visit | Attending: Radiation Oncology | Admitting: Radiation Oncology

## 2021-03-13 DIAGNOSIS — C792 Secondary malignant neoplasm of skin: Secondary | ICD-10-CM | POA: Diagnosis not present

## 2021-03-14 ENCOUNTER — Ambulatory Visit
Admission: RE | Admit: 2021-03-14 | Discharge: 2021-03-14 | Disposition: A | Discharge status: Home / Self Care | Payer: Medicare Other | Source: Ambulatory Visit | Attending: Radiation Oncology | Admitting: Radiation Oncology

## 2021-03-14 DIAGNOSIS — C792 Secondary malignant neoplasm of skin: Secondary | ICD-10-CM | POA: Diagnosis not present

## 2021-03-15 ENCOUNTER — Ambulatory Visit
Admission: RE | Admit: 2021-03-15 | Discharge: 2021-03-15 | Disposition: A | Discharge status: Home / Self Care | Payer: Medicare Other | Source: Ambulatory Visit | Attending: Radiation Oncology | Admitting: Radiation Oncology

## 2021-03-15 DIAGNOSIS — C792 Secondary malignant neoplasm of skin: Secondary | ICD-10-CM | POA: Diagnosis not present

## 2021-03-16 ENCOUNTER — Ambulatory Visit
Admission: RE | Admit: 2021-03-16 | Discharge: 2021-03-16 | Disposition: A | Discharge status: Home / Self Care | Payer: Medicare Other | Source: Ambulatory Visit | Attending: Radiation Oncology | Admitting: Radiation Oncology

## 2021-03-16 DIAGNOSIS — C792 Secondary malignant neoplasm of skin: Secondary | ICD-10-CM | POA: Diagnosis not present

## 2021-03-17 ENCOUNTER — Ambulatory Visit
Admission: RE | Admit: 2021-03-17 | Discharge: 2021-03-17 | Disposition: A | Discharge status: Home / Self Care | Payer: Medicare Other | Source: Ambulatory Visit | Attending: Radiation Oncology | Admitting: Radiation Oncology

## 2021-03-17 DIAGNOSIS — C792 Secondary malignant neoplasm of skin: Secondary | ICD-10-CM | POA: Diagnosis not present

## 2021-03-20 ENCOUNTER — Ambulatory Visit
Admission: RE | Admit: 2021-03-20 | Discharge: 2021-03-20 | Disposition: A | Discharge status: Home / Self Care | Payer: Medicare Other | Source: Ambulatory Visit | Attending: Radiation Oncology | Admitting: Radiation Oncology

## 2021-03-20 ENCOUNTER — Telehealth: Payer: Self-pay

## 2021-03-20 DIAGNOSIS — C792 Secondary malignant neoplasm of skin: Secondary | ICD-10-CM | POA: Diagnosis not present

## 2021-03-20 NOTE — Telephone Encounter (Signed)
Patient scheduled for last XRT on 4/22. Please schedule MD follow up 1 week after final XRT and notify pt of appt.

## 2021-03-20 NOTE — Telephone Encounter (Signed)
Done.... Pt will be given her New appt letter when she RTC on 03/21/21 for [facility]

## 2021-03-21 ENCOUNTER — Ambulatory Visit
Admission: RE | Admit: 2021-03-21 | Discharge: 2021-03-21 | Disposition: A | Discharge status: Home / Self Care | Payer: Medicare Other | Source: Ambulatory Visit | Attending: Radiation Oncology | Admitting: Radiation Oncology

## 2021-03-21 DIAGNOSIS — C792 Secondary malignant neoplasm of skin: Secondary | ICD-10-CM | POA: Diagnosis not present

## 2021-03-22 ENCOUNTER — Ambulatory Visit
Admission: RE | Admit: 2021-03-22 | Discharge: 2021-03-22 | Disposition: A | Discharge status: Home / Self Care | Payer: Medicare Other | Source: Ambulatory Visit | Attending: Radiation Oncology | Admitting: Radiation Oncology

## 2021-03-22 DIAGNOSIS — C792 Secondary malignant neoplasm of skin: Secondary | ICD-10-CM | POA: Diagnosis not present

## 2021-03-23 ENCOUNTER — Ambulatory Visit
Admission: RE | Admit: 2021-03-23 | Discharge: 2021-03-23 | Disposition: A | Discharge status: Home / Self Care | Payer: Medicare Other | Source: Ambulatory Visit | Attending: Radiation Oncology | Admitting: Radiation Oncology

## 2021-03-23 DIAGNOSIS — C792 Secondary malignant neoplasm of skin: Secondary | ICD-10-CM | POA: Diagnosis not present

## 2021-03-24 ENCOUNTER — Ambulatory Visit
Admission: RE | Admit: 2021-03-24 | Discharge: 2021-03-24 | Disposition: A | Discharge status: Home / Self Care | Payer: Medicare Other | Source: Ambulatory Visit | Attending: Radiation Oncology | Admitting: Radiation Oncology

## 2021-03-24 DIAGNOSIS — C792 Secondary malignant neoplasm of skin: Secondary | ICD-10-CM | POA: Diagnosis not present

## 2021-03-27 ENCOUNTER — Ambulatory Visit
Admission: RE | Admit: 2021-03-27 | Discharge: 2021-03-27 | Disposition: A | Discharge status: Home / Self Care | Payer: Medicare Other | Source: Ambulatory Visit | Attending: Radiation Oncology | Admitting: Radiation Oncology

## 2021-03-27 DIAGNOSIS — C792 Secondary malignant neoplasm of skin: Secondary | ICD-10-CM | POA: Diagnosis not present

## 2021-03-28 ENCOUNTER — Ambulatory Visit
Admission: RE | Admit: 2021-03-28 | Discharge: 2021-03-28 | Disposition: A | Discharge status: Home / Self Care | Payer: Medicare Other | Source: Ambulatory Visit | Attending: Radiation Oncology | Admitting: Radiation Oncology

## 2021-03-28 DIAGNOSIS — C792 Secondary malignant neoplasm of skin: Secondary | ICD-10-CM | POA: Diagnosis not present

## 2021-03-29 ENCOUNTER — Ambulatory Visit
Admission: RE | Admit: 2021-03-29 | Discharge: 2021-03-29 | Disposition: A | Discharge status: Home / Self Care | Payer: Medicare Other | Source: Ambulatory Visit | Attending: Radiation Oncology | Admitting: Radiation Oncology

## 2021-03-29 DIAGNOSIS — C792 Secondary malignant neoplasm of skin: Secondary | ICD-10-CM | POA: Diagnosis not present

## 2021-03-30 ENCOUNTER — Ambulatory Visit
Admission: RE | Admit: 2021-03-30 | Discharge: 2021-03-30 | Disposition: A | Discharge status: Home / Self Care | Payer: Medicare Other | Source: Ambulatory Visit | Attending: Radiation Oncology | Admitting: Radiation Oncology

## 2021-03-30 DIAGNOSIS — C792 Secondary malignant neoplasm of skin: Secondary | ICD-10-CM | POA: Diagnosis not present

## 2021-03-31 ENCOUNTER — Ambulatory Visit
Admission: RE | Admit: 2021-03-31 | Discharge: 2021-03-31 | Disposition: A | Discharge status: Home / Self Care | Payer: Medicare Other | Source: Ambulatory Visit | Attending: Radiation Oncology | Admitting: Radiation Oncology

## 2021-03-31 ENCOUNTER — Ambulatory Visit: Payer: Medicare Other

## 2021-03-31 DIAGNOSIS — C792 Secondary malignant neoplasm of skin: Secondary | ICD-10-CM | POA: Diagnosis not present

## 2021-04-03 ENCOUNTER — Ambulatory Visit
Admission: RE | Admit: 2021-04-03 | Discharge: 2021-04-03 | Disposition: A | Discharge status: Home / Self Care | Payer: Medicare Other | Source: Ambulatory Visit | Attending: Radiation Oncology | Admitting: Radiation Oncology

## 2021-04-03 DIAGNOSIS — C792 Secondary malignant neoplasm of skin: Secondary | ICD-10-CM | POA: Diagnosis not present

## 2021-04-10 ENCOUNTER — Other Ambulatory Visit: Payer: Self-pay

## 2021-04-10 ENCOUNTER — Inpatient Hospital Stay: Payer: Medicare Other | Attending: Oncology | Admitting: Oncology

## 2021-04-10 ENCOUNTER — Encounter: Payer: Self-pay | Admitting: Oncology

## 2021-04-10 VITALS — BP 121/81 | HR 87 | Temp 99.1°F | Resp 18 | Wt 121.0 lb

## 2021-04-10 DIAGNOSIS — M810 Age-related osteoporosis without current pathological fracture: Secondary | ICD-10-CM | POA: Diagnosis not present

## 2021-04-10 DIAGNOSIS — C50912 Malignant neoplasm of unspecified site of left female breast: Secondary | ICD-10-CM | POA: Diagnosis not present

## 2021-04-10 DIAGNOSIS — Z923 Personal history of irradiation: Secondary | ICD-10-CM | POA: Diagnosis not present

## 2021-04-10 DIAGNOSIS — C7989 Secondary malignant neoplasm of other specified sites: Secondary | ICD-10-CM

## 2021-04-10 DIAGNOSIS — Z9013 Acquired absence of bilateral breasts and nipples: Secondary | ICD-10-CM | POA: Diagnosis not present

## 2021-04-10 DIAGNOSIS — Z8542 Personal history of malignant neoplasm of other parts of uterus: Secondary | ICD-10-CM | POA: Insufficient documentation

## 2021-04-10 DIAGNOSIS — C50919 Malignant neoplasm of unspecified site of unspecified female breast: Secondary | ICD-10-CM | POA: Diagnosis not present

## 2021-04-10 DIAGNOSIS — C792 Secondary malignant neoplasm of skin: Secondary | ICD-10-CM | POA: Diagnosis present

## 2021-04-10 DIAGNOSIS — E039 Hypothyroidism, unspecified: Secondary | ICD-10-CM | POA: Insufficient documentation

## 2021-04-10 DIAGNOSIS — Z170 Estrogen receptor positive status [ER+]: Secondary | ICD-10-CM | POA: Insufficient documentation

## 2021-04-10 MED ORDER — TAMOXIFEN CITRATE 20 MG PO TABS: 20.0000 mg | ORAL_TABLET | Freq: Every day | ORAL | 0 refills | Status: DC

## 2021-04-10 NOTE — Progress Notes (Signed)
Hematology/Oncology progress note [name] Telephone:([phone] [phone]   Patient Care Team: [name], MD as PCP - General (Family Medicine)  REFERRING PROVIDER: [name], MD  CHIEF COMPLAINTS/REASON FOR VISIT:  metastatic breast cancer  HISTORY OF PRESENTING ILLNESS:   Cynthia [name] is a  80 y.o.  female with PMH listed below was seen in consultation at the request of  [name], MD  for evaluation of metastatic breast cancer Patient was previously seen by Dr. Jeb Levering last seen in 2017. Per his note, patient has a history of uterus carcinoma, diagnosed in 05/25/1999 2012, status post surgery [facility] Dr. Clarene Essex, Norma Fredrickson platinum, Taxol finished in November 2012, also status post radiation finished in October 2012.  More remotely she has a history of breast cancer in 1987/1988, status post bilateral mastectomy followed by tamoxifen for 5 years.  Patient has noticed an area on her anterior chest wall for couple of months.  It is hard, nontender.  She recently was seen by dermatologist and had a biopsy of the site on 10/26/2020 Biopsy showed dermal carcinoma consistent with metastatic breast carcinoma.  ER 90%, PR 90%, HER-2 IHC equivocal, FISH negative.  Patient reports feeling well otherwise.  Denies any new bone pain.  She has chronic aches and pains, especially bilateral lower extremities.  Appetite is fair.  No unintentional weight loss  # stage IV breast cancer-local chest wall recurrence, ER/PR 95%, HER-2 negative. 2/9/2022Status post chest wall resection-Dr. Bary Castilla Consistent with metastatic carcinoma, morphologically compatible with the patient's reported history of breast carcinoma. Posterior lateral margin is involved by tumor.  Intradermal nevus benign.    INTERVAL HISTORY Cynthia [name] is a 80 y.o. female who has above history reviewed by me today presents for follow up visit for management of breast cancer local chest wall  recurrence Problems and complaints are listed below: # 04/03/2021 s/p radiation.  She tolerated well. Mild skin erythema.  She presents to discuss systemic treatment.    Review of Systems  Constitutional: Negative for appetite change, chills, fatigue and fever.  HENT:   Negative for hearing loss and voice change.   Eyes: Negative for eye problems.  Respiratory: Negative for chest tightness and cough.   Cardiovascular: Negative for chest pain.  Gastrointestinal: Negative for abdominal distention, abdominal pain and blood in stool.  Endocrine: Negative for hot flashes.  Genitourinary: Negative for difficulty urinating and frequency.   Musculoskeletal: Negative for arthralgias.  Skin: Negative for itching and rash.       Anterior chest wall skin lesion status post biopsy  Neurological: Negative for extremity weakness.  Hematological: Negative for adenopathy.  Psychiatric/Behavioral: Negative for confusion.    MEDICAL HISTORY:  Past Medical History:  Diagnosis Date  . Arthritis   . Breast cancer ([place])   . Hypothyroidism   . Insomnia   . Metastatic breast carcinoma ([place]) 10/26/2020    chest-medial  DERMAL CARCINOMA CONSISTENT WITH METASTATIC BREAST CARCINOMA,  . Osteoporosis   . Squamous cell carcinoma    RT ARM  . Uterine cancer [facility])     SURGICAL HISTORY: Past Surgical History:  Procedure Laterality Date  . ABDOMINAL HYSTERECTOMY  05/2011   BSO- RADICAL AND CHEMOTHERAPY COMPLETE   . MASS EXCISION Left 01/18/2021   Procedure: EXCISION MASS LEFT CHEST WALL;  Surgeon: Robert Bellow, MD;  Location: [facility] ORS;  Service: General;  Laterality: Left;  left chest wall breast cancer  . MASTECTOMY     LEFT WITH AXILLARY NODE  DISSECTION, RADICAL RIGHT PROPHYLATIC W/O AXILLARY DISSECTION  . PLACEMENT OF BREAST IMPLANTS     SILICONE RECONSTRUCTION    SOCIAL HISTORY: Social History   Socioeconomic History  . Marital status: Married    Spouse name: Not on file  . Number of  children: Not on file  . Years of education: Not on file  . Highest education level: Not on file  Occupational History  . Not on file  Tobacco Use  . Smoking status: Never Smoker  . Smokeless tobacco: Never Used  Vaping Use  . Vaping Use: Never used  Substance and Sexual Activity  . Alcohol use: No    Alcohol/week: 0.0 standard drinks  . Drug use: No  . Sexual activity: Yes    Birth control/protection: Post-menopausal, Surgical  Other Topics Concern  . Not on file  Social History Narrative  . Not on file   Social Determinants of Health   Financial Resource Strain: Not on file  Food Insecurity: Not on file  Transportation Needs: Not on file  Physical Activity: Not on file  Stress: Not on file  Social Connections: Not on file  Intimate Partner Violence: Not on file    FAMILY HISTORY: Family History  Problem Relation Age of Onset  . Osteoporosis Mother   . Dementia Mother   . Hypertension Father   . Heart attack Father   . Alzheimer's disease Brother   . Diabetes Neg Hx   . Cancer Neg Hx     ALLERGIES:  is allergic to sulfa antibiotics.  MEDICATIONS:  Current Outpatient Medications  Medication Sig Dispense Refill  . alendronate (FOSAMAX) 70 MG tablet Take by mouth once a week. Take with a full glass of water. Do not lie down for the next 30 min Sunday    . CALCIUM-VITAMIN D PO Take 1 tablet by mouth daily. 1200 mg Calcium 25 mcg Vit D    . L-Theanine 200 MG CAPS Take 200 mg by mouth every evening.    Marland Kitchen levothyroxine (SYNTHROID) 50 MCG tablet Take 50 mcg by mouth daily before breakfast.    . Multiple Vitamins-Minerals (CENTRUM SILVER PO) Take 1 tablet by mouth daily.    . Multiple Vitamins-Minerals (PRESERVISION AREDS PO) Take 1 tablet by mouth daily.    . naproxen sodium (ALEVE) 220 MG tablet Take 220 mg by mouth daily as needed (Pain). Liquid gel    . tamoxifen (NOLVADEX) 20 MG tablet Take 1 tablet (20 mg total) by mouth daily. 90 tablet 0  . zolpidem (AMBIEN)  10 MG tablet Take 10 mg by mouth at bedtime.    Marland Kitchen HYDROcodone-acetaminophen (NORCO/VICODIN) 5-325 MG tablet Take 1 tablet by mouth every 4 (four) hours as needed for moderate pain. (Patient not taking: Reported on 04/10/2021) 12 tablet 0  . meloxicam (MOBIC) 15 MG tablet Take 15 mg by mouth daily as needed for pain. (Patient not taking: Reported on 04/10/2021)     No current facility-administered medications for this visit.     PHYSICAL EXAMINATION: ECOG PERFORMANCE STATUS: 0 - Asymptomatic Vitals:   04/10/21 1341  BP: 121/81  Pulse: 87  Resp: 18  Temp: 99.1 F (37.3 C)   Filed Weights   04/10/21 1341  Weight: 121 lb (54.9 kg)    Physical Exam Constitutional:      General: She is not in acute distress. HENT:     Head: Normocephalic and atraumatic.  Eyes:     General: No scleral icterus. Cardiovascular:     Rate and  Rhythm: Normal rate and regular rhythm.     Heart sounds: Normal heart sounds.  Pulmonary:     Effort: Pulmonary effort is normal. No respiratory distress.     Breath sounds: No wheezing.  Abdominal:     General: Bowel sounds are normal. There is no distension.     Palpations: Abdomen is soft.  Musculoskeletal:        General: No deformity. Normal range of motion.     Cervical back: Normal range of motion and neck supple.  Skin:    General: Skin is warm and dry.     Findings: No erythema or rash.  Neurological:     Mental Status: She is alert and oriented to person, place, and time. Mental status is at baseline.     Cranial Nerves: No cranial nerve deficit.     Coordination: Coordination normal.  Psychiatric:        Mood and Affect: Mood normal.       LABORATORY DATA:  I have reviewed the data as listed Lab Results  Component Value Date   WBC 5.5 06/21/2017   HGB 13.7 06/21/2017   HCT 40.4 06/21/2017   MCV 92.5 06/21/2017   PLT 259 06/21/2017   No results for input(s): NA, K, CL, CO2, GLUCOSE, BUN, CREATININE, CALCIUM, GFRNONAA, GFRAA, PROT,  ALBUMIN, AST, ALT, ALKPHOS, BILITOT, BILIDIR, IBILI in the last 8760 hours. Iron/TIBC/Ferritin/ %Sat No results found for: IRON, TIBC, FERRITIN, IRONPCTSAT    RADIOGRAPHIC STUDIES: I have personally reviewed the radiological images as listed and agreed with the findings in the report. No results found.    ASSESSMENT & PLAN:  1. Chest wall recurrence of breast cancer, unspecified laterality ([place])   2. Osteoporosis without current pathological fracture, unspecified osteoporosis type   Cancer Staging Metastatic breast cancer ([place]) Staging form: Breast, AJCC 8th Edition - Clinical: Stage IV (cTX, cNX, cM1, ER+, PR+, HER2-) - Signed by Earlie Server, MD on 01/26/2021  stage IV/local recurrent breast cancer-local chest wall recurrence, ER/PR 95%, HER-2 negative. S/p resection, positive margin, s/p radiation.  Given that she has a local recurrence which has now been resected, I will reserve CDK 4/5 behavior treatments for the future if she develops systemic metastasis. Osteoporosis on oral bisphosphonate alendronate.   I discussed about her options of anti estrogen therapy. Discussed about options of aromatase inhibitor and tamoxifen. The potential side effects profile was discussed with her in details. She has osteoporosis on Alledronate.  History of endometrial cancer with previous use of tamoxifen, status post surgery, chemotherapy and radiation 2012- She opts to Tamoxifen. Side effects of tamoxifen including but not limited to hot flush,  fatigue, retinopathy, thrombosis, discussed with patient. Patient voices understanding and is willing to proceed with treatment. Rx was sent.  Recommend her to take otc Aspirin 21m daily.   Recommend patient to continue calcium and vitamin D supplementation.    Orders Placed This Encounter  Procedures  . CBC with Differential/Platelet    Standing Status:   Future    Standing Expiration Date:   04/10/2022  . Comprehensive metabolic panel    Standing  Status:   Future    Standing Expiration Date:   04/10/2022    All questions were answered. The patient knows to call the clinic with any problems questions or concerns.  cc HMaryland Pink MD    Return of visit: 6 weeks.   ZEarlie Server MD, PhD Hematology Oncology CTenn[facility] - Jamestownat AKnightsbridge Surgery CenterPager- [phone]/01/2021

## 2021-04-10 NOTE — Progress Notes (Signed)
Pt here for follow up. No new concerns or breast issues.  

## 2021-04-11 ENCOUNTER — Telehealth: Payer: Self-pay | Admitting: *Deleted

## 2021-04-11 NOTE — Telephone Encounter (Signed)
Patient called and said that she was told not to take the Aleve for her knee pain and was told to take something over the counter for it, but cannot remember what she was told to take. She does remember that she is to take ASA 81 mg. Please advise

## 2021-04-11 NOTE — Telephone Encounter (Signed)
What do you suggest she take for the knee pain instead of the Aleve?

## 2021-04-11 NOTE — Telephone Encounter (Signed)
I recommend her to take Aspirin for VTE prophylaxis due to being on Tamoxifen. She says she is taking Aleeve twice a day and plans to switch her nighttime Aleeve to Aspirin.

## 2021-04-12 NOTE — Telephone Encounter (Signed)
Is there anything OTC that she can take for knee pain, that will not interfere with Tamoxifen?

## 2021-04-12 NOTE — Telephone Encounter (Signed)
Ok to do voltaren gel per MD. Pt may pick up OTC.

## 2021-04-12 NOTE — Telephone Encounter (Signed)
Can she use Voltaren Gel topically

## 2021-04-12 NOTE — Telephone Encounter (Signed)
Patient notified and voiced understanding.

## 2021-05-12 ENCOUNTER — Telehealth: Payer: Self-pay | Admitting: Oncology

## 2021-05-12 ENCOUNTER — Ambulatory Visit: Payer: Medicare Other | Admitting: Radiation Oncology

## 2021-05-12 NOTE — Telephone Encounter (Signed)
Patient called to request to cancel her appointments on 6/20 (6 wk MD f/u with labs). She stated that her husband is very ill. She declined to r/s at this time and will call back when able.

## 2021-05-29 ENCOUNTER — Telehealth: Payer: Self-pay | Admitting: Oncology

## 2021-05-29 ENCOUNTER — Other Ambulatory Visit: Payer: Medicare Other

## 2021-05-29 ENCOUNTER — Ambulatory Visit: Payer: Medicare Other | Admitting: Oncology

## 2021-05-29 NOTE — Telephone Encounter (Signed)
Patient canceled 6/20 appt due to hospice situation with husband.  She is going to see D.r Schermerhorn on 6/29 and is requesting that we relay which labs are needed so that they can be drawn then.  She will then determine when to reschedule follow up.  Routing to clinical team.

## 2021-05-30 NOTE — Telephone Encounter (Signed)
Dr. Ouida Sills is affiliated with [facility] and can't accept lab orders from a [facility] provider, so Dr. Collie Siad labs can't be drawn at their office.    Tried calling patient to inform her but did not get an answer and no option to leave a message.

## 2021-07-03 ENCOUNTER — Other Ambulatory Visit: Payer: Self-pay | Admitting: Oncology

## 2021-07-03 NOTE — Telephone Encounter (Signed)
Pt called to cancel appt on 6/20 due to husband's illness. Will you contact pt to see if she is willing to r/s now?

## 2021-07-28 ENCOUNTER — Encounter: Payer: Self-pay | Admitting: Oncology

## 2021-07-28 ENCOUNTER — Inpatient Hospital Stay: Payer: Medicare Other | Attending: Oncology

## 2021-07-28 ENCOUNTER — Inpatient Hospital Stay: Payer: Medicare Other | Admitting: Oncology

## 2021-07-28 VITALS — BP 130/75 | HR 88 | Temp 98.8°F | Resp 16 | Wt 122.2 lb

## 2021-07-28 DIAGNOSIS — C50911 Malignant neoplasm of unspecified site of right female breast: Secondary | ICD-10-CM | POA: Insufficient documentation

## 2021-07-28 DIAGNOSIS — Z791 Long term (current) use of non-steroidal anti-inflammatories (NSAID): Secondary | ICD-10-CM | POA: Diagnosis not present

## 2021-07-28 DIAGNOSIS — K2210 Ulcer of esophagus without bleeding: Secondary | ICD-10-CM | POA: Diagnosis not present

## 2021-07-28 DIAGNOSIS — C7989 Secondary malignant neoplasm of other specified sites: Secondary | ICD-10-CM | POA: Diagnosis not present

## 2021-07-28 DIAGNOSIS — C50919 Malignant neoplasm of unspecified site of unspecified female breast: Secondary | ICD-10-CM

## 2021-07-28 DIAGNOSIS — Z79810 Long term (current) use of selective estrogen receptor modulators (SERMs): Secondary | ICD-10-CM | POA: Insufficient documentation

## 2021-07-28 DIAGNOSIS — Z9013 Acquired absence of bilateral breasts and nipples: Secondary | ICD-10-CM | POA: Diagnosis not present

## 2021-07-28 DIAGNOSIS — M129 Arthropathy, unspecified: Secondary | ICD-10-CM | POA: Diagnosis not present

## 2021-07-28 DIAGNOSIS — M810 Age-related osteoporosis without current pathological fracture: Secondary | ICD-10-CM | POA: Insufficient documentation

## 2021-07-28 DIAGNOSIS — G4700 Insomnia, unspecified: Secondary | ICD-10-CM | POA: Insufficient documentation

## 2021-07-28 DIAGNOSIS — M79604 Pain in right leg: Secondary | ICD-10-CM | POA: Insufficient documentation

## 2021-07-28 DIAGNOSIS — Z79899 Other long term (current) drug therapy: Secondary | ICD-10-CM | POA: Insufficient documentation

## 2021-07-28 DIAGNOSIS — Z170 Estrogen receptor positive status [ER+]: Secondary | ICD-10-CM | POA: Diagnosis not present

## 2021-07-28 DIAGNOSIS — M79605 Pain in left leg: Secondary | ICD-10-CM | POA: Diagnosis not present

## 2021-07-28 DIAGNOSIS — E039 Hypothyroidism, unspecified: Secondary | ICD-10-CM | POA: Diagnosis not present

## 2021-07-28 LAB — COMPREHENSIVE METABOLIC PANEL
ALT: 20 U/L (ref 0–44)
AST: 24 U/L (ref 15–41)
Albumin: 3.8 g/dL (ref 3.5–5.0)
Alkaline Phosphatase: 32 U/L — ABNORMAL LOW (ref 38–126)
Anion gap: 7 (ref 5–15)
BUN: 20 mg/dL (ref 8–23)
CO2: 24 mmol/L (ref 22–32)
Calcium: 8.9 mg/dL (ref 8.9–10.3)
Chloride: 107 mmol/L (ref 98–111)
Creatinine, Ser: 0.81 mg/dL (ref 0.44–1.00)
GFR, Estimated: >60 mL/min (ref >60)
Glucose, Bld: 120 mg/dL — ABNORMAL HIGH (ref 70–99)
Potassium: 4 mmol/L (ref 3.5–5.1)
Sodium: 138 mmol/L (ref 135–145)
Total Bilirubin: 0.9 mg/dL (ref 0.3–1.2)
Total Protein: 6.5 g/dL (ref 6.5–8.1)

## 2021-07-28 LAB — CBC WITH DIFFERENTIAL/PLATELET
Abs Immature Granulocytes: 0.03 10*3/uL (ref 0.00–0.07)
Basophils Absolute: 0.1 10*3/uL (ref 0.0–0.1)
Basophils Relative: 1 %
Eosinophils Absolute: 0.1 10*3/uL (ref 0.0–0.5)
Eosinophils Relative: 1 %
HCT: 36.9 % (ref 36.0–46.0)
Hemoglobin: 12.3 g/dL (ref 12.0–15.0)
Immature Granulocytes: 1 %
Lymphocytes Relative: 28 %
Lymphs Abs: 1.8 10*3/uL (ref 0.7–4.0)
MCH: 32.3 pg (ref 26.0–34.0)
MCHC: 33.3 g/dL (ref 30.0–36.0)
MCV: 96.9 fL (ref 80.0–100.0)
Monocytes Absolute: 0.5 10*3/uL (ref 0.1–1.0)
Monocytes Relative: 9 %
Neutro Abs: 3.9 10*3/uL (ref 1.7–7.7)
Neutrophils Relative %: 60 %
Platelets: 213 10*3/uL (ref 150–400)
RBC: 3.81 MIL/uL — ABNORMAL LOW (ref 3.87–5.11)
RDW: 13 % (ref 11.5–15.5)
WBC: 6.3 10*3/uL (ref 4.0–10.5)
nRBC: 0 % (ref 0.0–0.2)

## 2021-07-28 NOTE — Progress Notes (Signed)
Hematology/Oncology progress note [facility] Telephone:([phone] [phone]   Patient Care Team: [name], MD as PCP - General (Family Medicine)  REFERRING PROVIDER: [name], MD  CHIEF COMPLAINTS/REASON FOR VISIT:  metastatic breast cancer  HISTORY OF PRESENTING ILLNESS: Cynthia [name] is a  80 y.o.  female with PMH listed below was seen in consultation at the request of  [name], MD  for evaluation of metastatic breast cancer Patient was previously seen by Dr. Jeb Levering last seen in 2017. 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 Clarene Essex, Norma Fredrickson platinum, Taxol finished in November 2012, also status post radiation finished in October 2012.  More remotely she has a history of breast cancer in 1987/1988, status post bilateral mastectomy followed by tamoxifen for 5 years.  Patient has noticed an area on her anterior chest wall for couple of months.  It is hard, nontender.  She recently was seen by dermatologist and had a biopsy of the site on 10/26/2020 Biopsy showed dermal carcinoma consistent with metastatic breast carcinoma.  ER 90%, PR 90%, HER-2 IHC equivocal, FISH negative.  Patient reports feeling well otherwise.  Denies any new bone pain.  She has chronic aches and pains, especially bilateral lower extremities.  Appetite is fair.  No unintentional weight loss  # stage IV breast cancer-local chest wall recurrence, ER/PR 95%, HER-2 negative. 2/9/2022Status post chest wall resection-Dr. Bary Castilla Consistent with metastatic carcinoma, morphologically compatible with the patient's reported history of breast carcinoma. Posterior lateral margin is involved by tumor.  Intradermal nevus benign.    INTERVAL HISTORY Cynthia [name] is an 80 year old female with past medical history significant for hypothyroidism, osteoporosis, history of neoplasm of endometrium and is followed by  Dr. Tasia Catchings for metastatic breast cancer status post bilateral mastectomy.  She is status post radiation which she completed on 04/03/2021.  Tolerated well except for some mild skin irritation.  She was started on tamoxifen about 6 months ago which she appears to tolerate well.  Reports some hoarseness to her voice and she is unsure if this is related to initiating tamoxifen.  States that her husband recently passed away and she continues to grieve.  Reports not eating and drinking very well.  Has been compliant with her calcium and vitamin D.   Review of Systems  Constitutional:  Positive for fatigue. Negative for appetite change, fever and unexpected weight change.  HENT:   Positive for voice change. Negative for nosebleeds, sore throat and trouble swallowing.   Eyes: Negative.   Respiratory: Negative.  Negative for cough, shortness of breath and wheezing.   Cardiovascular: Negative.  Negative for chest pain and leg swelling.  Gastrointestinal:  Negative for abdominal pain, blood in stool, constipation, diarrhea, nausea and vomiting.  Endocrine: Negative.   Genitourinary: Negative.  Negative for bladder incontinence, hematuria and nocturia.   Musculoskeletal: Negative.  Negative for back pain and flank pain.  Skin: Negative.   Neurological: Negative.  Negative for dizziness, headaches, light-headedness and numbness.  Hematological: Negative.   Psychiatric/Behavioral:  Positive for depression. Negative for confusion. The patient is not nervous/anxious.    MEDICAL HISTORY:  Past Medical History:  Diagnosis Date   Arthritis    Breast cancer ([place])    Hypothyroidism    Insomnia    Metastatic breast carcinoma ([place]) 10/26/2020    chest-medial  DERMAL CARCINOMA CONSISTENT WITH METASTATIC BREAST CARCINOMA,   Osteoporosis    Squamous cell carcinoma  RT ARM   Uterine cancer ([place])     SURGICAL HISTORY: Past Surgical History:  Procedure Laterality Date   ABDOMINAL HYSTERECTOMY  05/2011   BSO-  RADICAL AND CHEMOTHERAPY COMPLETE    MASS EXCISION Left 01/18/2021   Procedure: EXCISION MASS LEFT CHEST WALL;  Surgeon: Robert Bellow, MD;  Location: [facility] ORS;  Service: General;  Laterality: Left;  left chest wall breast cancer   MASTECTOMY     LEFT WITH AXILLARY NODE DISSECTION, RADICAL RIGHT PROPHYLATIC W/O AXILLARY DISSECTION   PLACEMENT OF BREAST IMPLANTS     SILICONE RECONSTRUCTION    SOCIAL HISTORY: Social History   Socioeconomic History   Marital status: Married    Spouse name: Not on file   Number of children: Not on file   Years of education: Not on file   Highest education level: Not on file  Occupational History   Not on file  Tobacco Use   Smoking status: Never   Smokeless tobacco: Never  Vaping Use   Vaping Use: Never used  Substance and Sexual Activity   Alcohol use: No    Alcohol/week: 0.0 standard drinks   Drug use: No   Sexual activity: Yes    Birth control/protection: Post-menopausal, Surgical  Other Topics Concern   Not on file  Social History Narrative   Not on file   Social Determinants of Health   Financial Resource Strain: Not on file  Food Insecurity: Not on file  Transportation Needs: Not on file  Physical Activity: Not on file  Stress: Not on file  Social Connections: Not on file  Intimate Partner Violence: Not on file    FAMILY HISTORY: Family History  Problem Relation Age of Onset   Osteoporosis Mother    Dementia Mother    Hypertension Father    Heart attack Father    Alzheimer's disease Brother    Diabetes Neg Hx    Cancer Neg Hx     ALLERGIES:  is allergic to sulfa antibiotics.  MEDICATIONS:  Current Outpatient Medications  Medication Sig Dispense Refill   alendronate (FOSAMAX) 70 MG tablet Take by mouth once a week. Take with a full glass of water. Do not lie down for the next 30 min Sunday     CALCIUM-VITAMIN D PO Take 1 tablet by mouth daily. 1200 mg Calcium 25 mcg Vit D     L-Theanine 200 MG CAPS Take 200 mg  by mouth every evening.     levothyroxine (SYNTHROID) 75 MCG tablet Take 50 mcg by mouth daily before breakfast.     meloxicam (MOBIC) 15 MG tablet Take 15 mg by mouth daily as needed for pain.     Multiple Vitamins-Minerals (CENTRUM SILVER PO) Take 1 tablet by mouth daily.     Multiple Vitamins-Minerals (PRESERVISION AREDS PO) Take 1 tablet by mouth daily.     naproxen sodium (ALEVE) 220 MG tablet Take 220 mg by mouth daily as needed (Pain). Liquid gel     tamoxifen (NOLVADEX) 20 MG tablet Take 1 tablet (20 mg total) by mouth daily. Patient MUST make a follow up appointment before prescription will be filled 30 tablet 0   zolpidem (AMBIEN) 10 MG tablet Take 10 mg by mouth at bedtime.     HYDROcodone-acetaminophen (NORCO/VICODIN) 5-325 MG tablet Take 1 tablet by mouth every 4 (four) hours as needed for moderate pain. (Patient not taking: No sig reported) 12 tablet 0   No current facility-administered medications for this visit.     PHYSICAL  EXAMINATION: ECOG PERFORMANCE STATUS: 0 - Asymptomatic Vitals:   07/28/21 1150  BP: 130/75  Pulse: 88  Resp: 16  Temp: 98.8 F (37.1 C)   Filed Weights   07/28/21 1150  Weight: 122 lb 3.2 oz (55.4 kg)    Physical Exam Constitutional:      Appearance: Normal appearance.  HENT:     Head: Normocephalic and atraumatic.  Eyes:     Pupils: Pupils are equal, round, and reactive to light.  Cardiovascular:     Rate and Rhythm: Normal rate and regular rhythm.     Heart sounds: Normal heart sounds. No murmur heard. Pulmonary:     Effort: Pulmonary effort is normal.     Breath sounds: Normal breath sounds. No wheezing.  Abdominal:     General: Bowel sounds are normal. There is no distension.     Palpations: Abdomen is soft.     Tenderness: There is no abdominal tenderness.  Musculoskeletal:        General: Normal range of motion.     Cervical back: Normal range of motion.  Skin:    General: Skin is warm and dry.     Findings: No rash.   Neurological:     Mental Status: She is alert and oriented to person, place, and time.  Psychiatric:        Judgment: Judgment normal.      LABORATORY DATA:  I have reviewed the data as listed Lab Results  Component Value Date   WBC 6.3 07/28/2021   HGB 12.3 07/28/2021   HCT 36.9 07/28/2021   MCV 96.9 07/28/2021   PLT 213 07/28/2021   Recent Labs    07/28/21 1117  NA 138  K 4.0  CL 107  CO2 24  GLUCOSE 120*  BUN 20  CREATININE 0.81  CALCIUM 8.9  GFRNONAA >60  PROT 6.5  ALBUMIN 3.8  AST 24  ALT 20  ALKPHOS 32*  BILITOT 0.9   Iron/TIBC/Ferritin/ %Sat No results found for: IRON, TIBC, FERRITIN, IRONPCTSAT    RADIOGRAPHIC STUDIES: I have personally reviewed the radiological images as listed and agreed with the findings in the report. No results found.    ASSESSMENT & PLAN:  No diagnosis found. Cancer Staging Metastatic breast cancer ([place]) Staging form: Breast, AJCC 8th Edition - Clinical: Stage IV (cTX, cNX, cM1, ER+, PR+, HER2-) - Signed by Earlie Server, MD on 01/26/2021  stage IV/local recurrent breast cancer-local chest wall recurrence, ER/PR 95%, HER-2 negative- Has history of breast cancer back in 1980 07/1988 status post bilateral mastectomy followed by 5 years of tamoxifen.  Had local recurrence status post resection with positive margins followed by radiation.  She was recently started on tamoxifen which she appears to tolerate well except for some mild voice changes; hoarseness.  We will have her return to clinic in 6 months with lab work and MD assessment.  Voice hoarseness- Unclear etiology. Medication related??  I have asked her to keep an eye on this.  Previously she was on tamoxifen with great tolerance.  According to up-to-date can cause pharyngitis (14%) but no evidence of sore throat or fever. She is also on Fosamax.and has been known to cause acid regurgitation, dyspepsia, dysphagia, esophageal ulcer and GERD.  Patient will let us know if she  notices any changes.  Osteoporosis- She is on Fosamax.  She is also taking calcium and vitamin D.  I spent 25 minutes dedicated to the care of this patient (face-to-face and non-face-to-face) on the date  of the encounter to include what is described in the assessment and plan.  No orders of the defined types were placed in this encounter.   All questions were answered. The patient knows to call the clinic with any problems questions or concerns.  cc [name], MD   Faythe Casa, NP 08/01/2021 9:36 AM

## 2021-07-28 NOTE — Progress Notes (Signed)
Patient has noticed voice hoarsenss after taking the Tamoxifen.  She is mourning the loss of her husband passing a couple months ago.

## 2022-01-29 ENCOUNTER — Encounter: Payer: Self-pay | Admitting: Oncology

## 2022-01-29 ENCOUNTER — Inpatient Hospital Stay: Payer: Medicare Other | Attending: Oncology

## 2022-01-29 ENCOUNTER — Inpatient Hospital Stay: Payer: Medicare Other | Admitting: Oncology

## 2022-01-29 ENCOUNTER — Other Ambulatory Visit: Payer: Self-pay

## 2022-01-29 VITALS — BP 132/75 | HR 88 | Temp 97.4°F | Wt 126.5 lb

## 2022-01-29 DIAGNOSIS — Z90710 Acquired absence of both cervix and uterus: Secondary | ICD-10-CM | POA: Insufficient documentation

## 2022-01-29 DIAGNOSIS — C50919 Malignant neoplasm of unspecified site of unspecified female breast: Secondary | ICD-10-CM | POA: Insufficient documentation

## 2022-01-29 DIAGNOSIS — M810 Age-related osteoporosis without current pathological fracture: Secondary | ICD-10-CM | POA: Insufficient documentation

## 2022-01-29 DIAGNOSIS — Z8542 Personal history of malignant neoplasm of other parts of uterus: Secondary | ICD-10-CM | POA: Insufficient documentation

## 2022-01-29 DIAGNOSIS — Z170 Estrogen receptor positive status [ER+]: Secondary | ICD-10-CM | POA: Diagnosis not present

## 2022-01-29 LAB — COMPREHENSIVE METABOLIC PANEL
ALT: 16 U/L (ref 0–44)
AST: 24 U/L (ref 15–41)
Albumin: 4 g/dL (ref 3.5–5.0)
Alkaline Phosphatase: 61 U/L (ref 38–126)
Anion gap: 6 (ref 5–15)
BUN: 14 mg/dL (ref 8–23)
CO2: 24 mmol/L (ref 22–32)
Calcium: 9 mg/dL (ref 8.9–10.3)
Chloride: 107 mmol/L (ref 98–111)
Creatinine, Ser: 0.66 mg/dL (ref 0.44–1.00)
GFR, Estimated: >60 mL/min (ref >60)
Glucose, Bld: 116 mg/dL — ABNORMAL HIGH (ref 70–99)
Potassium: 3.7 mmol/L (ref 3.5–5.1)
Sodium: 137 mmol/L (ref 135–145)
Total Bilirubin: 0.6 mg/dL (ref 0.3–1.2)
Total Protein: 6.9 g/dL (ref 6.5–8.1)

## 2022-01-29 LAB — CBC WITH DIFFERENTIAL/PLATELET
Abs Immature Granulocytes: 0.02 10*3/uL (ref 0.00–0.07)
Basophils Absolute: 0.1 10*3/uL (ref 0.0–0.1)
Basophils Relative: 1 %
Eosinophils Absolute: 0.1 10*3/uL (ref 0.0–0.5)
Eosinophils Relative: 1 %
HCT: 40.1 % (ref 36.0–46.0)
Hemoglobin: 13.2 g/dL (ref 12.0–15.0)
Immature Granulocytes: 0 %
Lymphocytes Relative: 34 %
Lymphs Abs: 1.8 10*3/uL (ref 0.7–4.0)
MCH: 31.4 pg (ref 26.0–34.0)
MCHC: 32.9 g/dL (ref 30.0–36.0)
MCV: 95.2 fL (ref 80.0–100.0)
Monocytes Absolute: 0.4 10*3/uL (ref 0.1–1.0)
Monocytes Relative: 8 %
Neutro Abs: 3.1 10*3/uL (ref 1.7–7.7)
Neutrophils Relative %: 56 %
Platelets: 241 10*3/uL (ref 150–400)
RBC: 4.21 MIL/uL (ref 3.87–5.11)
RDW: 13.2 % (ref 11.5–15.5)
WBC: 5.5 10*3/uL (ref 4.0–10.5)
nRBC: 0 % (ref 0.0–0.2)

## 2022-01-29 MED ORDER — ASPIRIN EC 81 MG PO TBEC: 81.0000 mg | DELAYED_RELEASE_TABLET | Freq: Every day | ORAL | 1 refills | Status: AC

## 2022-01-29 MED ORDER — TAMOXIFEN CITRATE 20 MG PO TABS: 20.0000 mg | ORAL_TABLET | Freq: Every day | ORAL | 5 refills | Status: DC

## 2022-01-29 NOTE — Progress Notes (Signed)
Hematology/Oncology Progress note [phone] [phone]      Patient Care Team: [name], MD as PCP - General (Family Medicine)  REFERRING PROVIDER: [name], MD  CHIEF COMPLAINTS/REASON FOR VISIT:  metastatic breast cancer  HISTORY OF PRESENTING ILLNESS:   Cynthia [name] is a  81 y.o.  female with PMH listed below was seen in consultation at the request of  [name], MD  for evaluation of metastatic breast cancer Patient was previously seen by Dr. Jeb Levering last seen in 2017. 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 Clarene Essex, Norma Fredrickson platinum, Taxol finished in November 2012, also status post radiation finished in October 2012.  More remotely she has a history of breast cancer in 1987/1988, status post bilateral mastectomy followed by tamoxifen for 5 years.  Patient has noticed an area on her anterior chest wall for couple of months.  It is hard, nontender.  She recently was seen by dermatologist and had a biopsy of the site on 10/26/2020 Biopsy showed dermal carcinoma consistent with metastatic breast carcinoma.  ER 90%, PR 90%, HER-2 IHC equivocal, FISH negative.  Patient reports feeling well otherwise.  Denies any new bone pain.  She has chronic aches and pains, especially bilateral lower extremities.  Appetite is fair.  No unintentional weight loss  # stage IV breast cancer-local chest wall recurrence, ER/PR 95%, HER-2 negative. 2/9/2022Status post chest wall resection-Dr. Bary Castilla Consistent with metastatic carcinoma, morphologically compatible with the patient's reported history of breast carcinoma. Posterior lateral margin is involved by tumor.  Intradermal nevus benign.  # 04/03/2021 s/p radiation.   INTERVAL HISTORY [name] is a 81 y.o. female who has above history reviewed by me today presents for follow up visit for management of breast cancer local chest wall  recurrence Patient reports feeling well today.  Patient was recently seen by Dr. Bary Castilla. Patient is currently not taking tamoxifen. She was started on tamoxifen last year and was supposed to follow-up 4 to 6 weeks after the start for evaluation of tolerability. Patient called and canceled appointment due to deterioration of her husband's health. Patient rescheduled and was seen by nurse practitioner Anderson Malta in August 2022. Recommendation was to continue tamoxifen however patient was not aware and prescription was not given. Today she has no new complaints.  Doing very well.   Review of Systems  Constitutional:  Negative for appetite change, chills, fatigue and fever.  HENT:   Negative for hearing loss and voice change.   Eyes:  Negative for eye problems.  Respiratory:  Negative for chest tightness and cough.   Cardiovascular:  Negative for chest pain.  Gastrointestinal:  Negative for abdominal distention, abdominal pain and blood in stool.  Endocrine: Negative for hot flashes.  Genitourinary:  Negative for difficulty urinating and frequency.   Musculoskeletal:  Negative for arthralgias.  Skin:  Negative for itching and rash.  Neurological:  Negative for extremity weakness.  Hematological:  Negative for adenopathy.  Psychiatric/Behavioral:  Negative for confusion.    MEDICAL HISTORY:  Past Medical History:  Diagnosis Date   Arthritis    Breast cancer ([place])    Hypothyroidism    Insomnia    Metastatic breast carcinoma ([place]) 10/26/2020    chest-medial  DERMAL CARCINOMA CONSISTENT WITH METASTATIC BREAST CARCINOMA,   Osteoporosis    Squamous cell carcinoma    RT ARM   Uterine cancer ([place])     SURGICAL HISTORY: Past Surgical  History:  Procedure Laterality Date   ABDOMINAL HYSTERECTOMY  05/2011   BSO- RADICAL AND CHEMOTHERAPY COMPLETE    MASS EXCISION Left 01/18/2021   Procedure: EXCISION MASS LEFT CHEST WALL;  Surgeon: Robert Bellow, MD;  Location: [facility] ORS;  Service:  General;  Laterality: Left;  left chest wall breast cancer   MASTECTOMY     LEFT WITH AXILLARY NODE DISSECTION, RADICAL RIGHT PROPHYLATIC W/O AXILLARY DISSECTION   PLACEMENT OF BREAST IMPLANTS     SILICONE RECONSTRUCTION    SOCIAL HISTORY: Social History   Socioeconomic History   Marital status: Married    Spouse name: Not on file   Number of children: Not on file   Years of education: Not on file   Highest education level: Not on file  Occupational History   Not on file  Tobacco Use   Smoking status: Never   Smokeless tobacco: Never  Vaping Use   Vaping Use: Never used  Substance and Sexual Activity   Alcohol use: No    Alcohol/week: 0.0 standard drinks   Drug use: No   Sexual activity: Yes    Birth control/protection: Post-menopausal, Surgical  Other Topics Concern   Not on file  Social History Narrative   Not on file   Social Determinants of Health   Financial Resource Strain: Not on file  Food Insecurity: Not on file  Transportation Needs: Not on file  Physical Activity: Not on file  Stress: Not on file  Social Connections: Not on file  Intimate Partner Violence: Not on file    FAMILY HISTORY: Family History  Problem Relation Age of Onset   Osteoporosis Mother    Dementia Mother    Hypertension Father    Heart attack Father    Alzheimer's disease Brother    Diabetes Neg Hx    Cancer Neg Hx     ALLERGIES:  is allergic to sulfa antibiotics.  MEDICATIONS:  Current Outpatient Medications  Medication Sig Dispense Refill   aspirin EC 81 MG tablet Take 1 tablet (81 mg total) by mouth daily. Swallow whole. 90 tablet 1   CALCIUM-VITAMIN D PO Take 1 tablet by mouth daily. 1200 mg Calcium 25 mcg Vit D     L-Theanine 200 MG CAPS Take 200 mg by mouth every evening.     levothyroxine (SYNTHROID) 75 MCG tablet Take 50 mcg by mouth daily before breakfast.     meloxicam (MOBIC) 15 MG tablet Take 15 mg by mouth daily as needed for pain.     Multiple  Vitamins-Minerals (CENTRUM SILVER PO) Take 1 tablet by mouth daily.     Multiple Vitamins-Minerals (PRESERVISION AREDS PO) Take 1 tablet by mouth daily.     naproxen sodium (ALEVE) 220 MG tablet Take 220 mg by mouth daily as needed (Pain). Liquid gel     zolpidem (AMBIEN) 10 MG tablet Take 10 mg by mouth at bedtime.     tamoxifen (NOLVADEX) 20 MG tablet Take 1 tablet (20 mg total) by mouth daily. Patient MUST make a follow up appointment before prescription will be filled 30 tablet 5   No current facility-administered medications for this visit.     PHYSICAL EXAMINATION: ECOG PERFORMANCE STATUS: 1 - Symptomatic but completely ambulatory Vitals:   01/29/22 1026  BP: 132/75  Pulse: 88  Temp: (!) 97.4 F (36.3 C)   Filed Weights   01/29/22 1026  Weight: 126 lb 8 oz (57.4 kg)    Physical Exam Constitutional:      General:  She is not in acute distress. HENT:     Head: Normocephalic and atraumatic.  Eyes:     General: No scleral icterus. Cardiovascular:     Rate and Rhythm: Normal rate and regular rhythm.     Heart sounds: Normal heart sounds.  Pulmonary:     Effort: Pulmonary effort is normal. No respiratory distress.     Breath sounds: No wheezing.  Abdominal:     General: Bowel sounds are normal. There is no distension.     Palpations: Abdomen is soft.  Musculoskeletal:        General: No deformity. Normal range of motion.     Cervical back: Normal range of motion and neck supple.  Skin:    General: Skin is warm and dry.     Findings: No erythema or rash.  Neurological:     Mental Status: She is alert and oriented to person, place, and time. Mental status is at baseline.     Cranial Nerves: No cranial nerve deficit.     Coordination: Coordination normal.  Psychiatric:        Mood and Affect: Mood normal.      LABORATORY DATA:  I have reviewed the data as listed Lab Results  Component Value Date   WBC 5.5 01/29/2022   HGB 13.2 01/29/2022   HCT 40.1 01/29/2022    MCV 95.2 01/29/2022   PLT 241 01/29/2022   Recent Labs    07/28/21 1117 01/29/22 0940  NA 138 137  K 4.0 3.7  CL 107 107  CO2 24 24  GLUCOSE 120* 116*  BUN 20 14  CREATININE 0.81 0.66  CALCIUM 8.9 9.0  GFRNONAA >60 >60  PROT 6.5 6.9  ALBUMIN 3.8 4.0  AST 24 24  ALT 20 16  ALKPHOS 32* 61  BILITOT 0.9 0.6   Iron/TIBC/Ferritin/ %Sat No results found for: IRON, TIBC, FERRITIN, IRONPCTSAT    RADIOGRAPHIC STUDIES: I have personally reviewed the radiological images as listed and agreed with the findings in the report. No results found.    ASSESSMENT & PLAN:  1. Metastatic breast cancer ([place])   2. Osteoporosis without current pathological fracture, unspecified osteoporosis type    Cancer Staging  Metastatic breast cancer ([place]) Staging form: Breast, AJCC 8th Edition - Clinical: Stage IV (cTX, cNX, cM1, ER+, PR+, HER2-) - Signed by Earlie Server, MD on 01/26/2021  stage IV/local recurrent breast cancer-local chest wall recurrence, ER/PR 95%, HER-2 negative. S/p resection, positive margin, s/p radiation.  Recommend patient to resume tamoxifen. Repeat PET scan for restaging.-Patient prefers to PET scan to be done in a few weeks as she is getting used to her new car.  Osteoporosis, continue calcium and vitamin D supplementation  Orders Placed This Encounter  Procedures   NM PET Image Restage (PS) Skull Base to Thigh (F-18 FDG)    Standing Status:   Future    Standing Expiration Date:   01/29/2023    Order Specific Question:   If indicated for the ordered procedure, I authorize the administration of a radiopharmaceutical per Radiology protocol    Answer:   Yes    Order Specific Question:   Preferred imaging location?    Answer:   [facility]    Order Specific Question:   Radiology Contrast Protocol - do NOT remove file path    Answer:   [email or web address]   CBC with Differential/Platelet    Standing Status:   Future    Standing  Expiration Date:   01/29/2023  Comprehensive metabolic panel    Standing Status:   Future    Standing Expiration Date:   01/29/2023    All questions were answered. The patient knows to call the clinic with any problems questions or concerns.  cc [name], MD    Return of visit: Follow-up in 4 months, or earlier if needed.  Earlie Server, MD, PhD 01/29/2022

## 2022-01-29 NOTE — Progress Notes (Signed)
Patient her for follow up. Patient doesn't voice any concerns.

## 2022-02-26 ENCOUNTER — Telehealth: Payer: Self-pay

## 2022-02-26 NOTE — Telephone Encounter (Signed)
Received call from the PET department saying that there are delays in the reopening and pt needs to be r/s. Please contact pt to r/s either to [facility] asap or if they want it done here, they said it will be approx mid may.  ?

## 2022-03-14 ENCOUNTER — Other Ambulatory Visit: Payer: Medicare Other

## 2022-04-03 ENCOUNTER — Ambulatory Visit
Admission: RE | Admit: 2022-04-03 | Discharge: 2022-04-03 | Disposition: A | Discharge status: Home / Self Care | Payer: Medicare Other | Source: Ambulatory Visit | Attending: Oncology | Admitting: Oncology

## 2022-04-03 ENCOUNTER — Ambulatory Visit: Payer: Medicare Other

## 2022-04-03 DIAGNOSIS — I517 Cardiomegaly: Secondary | ICD-10-CM | POA: Diagnosis not present

## 2022-04-03 DIAGNOSIS — K5730 Diverticulosis of large intestine without perforation or abscess without bleeding: Secondary | ICD-10-CM | POA: Diagnosis not present

## 2022-04-03 DIAGNOSIS — I700 Atherosclerosis of aorta: Secondary | ICD-10-CM | POA: Diagnosis not present

## 2022-04-03 DIAGNOSIS — M8580 Other specified disorders of bone density and structure, unspecified site: Secondary | ICD-10-CM | POA: Diagnosis not present

## 2022-04-03 DIAGNOSIS — I6523 Occlusion and stenosis of bilateral carotid arteries: Secondary | ICD-10-CM | POA: Diagnosis not present

## 2022-04-03 DIAGNOSIS — C50919 Malignant neoplasm of unspecified site of unspecified female breast: Secondary | ICD-10-CM | POA: Diagnosis present

## 2022-04-03 MED ORDER — FLUDEOXYGLUCOSE F - 18 (FDG) INJECTION
6.5000 | Freq: Once | INTRAVENOUS | Status: AC | PRN
  Administered 2022-04-03: 6.99 via INTRAVENOUS

## 2022-04-05 ENCOUNTER — Telehealth: Payer: Self-pay | Admitting: *Deleted

## 2022-04-05 NOTE — Telephone Encounter (Signed)
Patient called and left a message that someone called her today and patient thought that someone was calling her about her PET scan results.  I called patient back and let her know that the PET scan results have been resulted and I will send a message to Dr. Tasia Catchings and her team so that they can call her and give her the results.  Patient would like to be a told early in the morning she wakes up every day at 5 AM ?

## 2022-04-06 NOTE — Telephone Encounter (Signed)
Patient informed and verbalized understanding

## 2022-04-10 LAB — GLUCOSE, CAPILLARY: Glucose-Capillary: 95 mg/dL (ref 70–99)

## 2022-04-18 ENCOUNTER — Other Ambulatory Visit: Payer: Self-pay | Admitting: *Deleted

## 2022-04-18 MED ORDER — TAMOXIFEN CITRATE 20 MG PO TABS: 20.0000 mg | ORAL_TABLET | Freq: Every day | ORAL | 1 refills | Status: AC

## 2022-05-02 ENCOUNTER — Other Ambulatory Visit: Payer: Medicare Other

## 2022-05-29 ENCOUNTER — Inpatient Hospital Stay: Payer: Medicare Other | Admitting: Oncology

## 2022-05-29 ENCOUNTER — Inpatient Hospital Stay: Payer: Medicare Other

## 2022-06-06 ENCOUNTER — Inpatient Hospital Stay: Payer: Medicare Other | Admitting: Oncology

## 2022-06-06 ENCOUNTER — Inpatient Hospital Stay: Payer: Medicare Other | Attending: Oncology

## 2022-06-06 ENCOUNTER — Encounter: Payer: Self-pay | Admitting: Oncology

## 2022-06-06 VITALS — BP 116/88 | HR 89 | Temp 98.0°F | Wt 127.0 lb

## 2022-06-06 DIAGNOSIS — Z90722 Acquired absence of ovaries, bilateral: Secondary | ICD-10-CM | POA: Insufficient documentation

## 2022-06-06 DIAGNOSIS — M810 Age-related osteoporosis without current pathological fracture: Secondary | ICD-10-CM

## 2022-06-06 DIAGNOSIS — Z90710 Acquired absence of both cervix and uterus: Secondary | ICD-10-CM | POA: Diagnosis not present

## 2022-06-06 DIAGNOSIS — Z9013 Acquired absence of bilateral breasts and nipples: Secondary | ICD-10-CM | POA: Insufficient documentation

## 2022-06-06 DIAGNOSIS — Z923 Personal history of irradiation: Secondary | ICD-10-CM | POA: Insufficient documentation

## 2022-06-06 DIAGNOSIS — C541 Malignant neoplasm of endometrium: Secondary | ICD-10-CM | POA: Diagnosis not present

## 2022-06-06 DIAGNOSIS — C50919 Malignant neoplasm of unspecified site of unspecified female breast: Secondary | ICD-10-CM | POA: Diagnosis not present

## 2022-06-06 DIAGNOSIS — C7989 Secondary malignant neoplasm of other specified sites: Secondary | ICD-10-CM | POA: Diagnosis not present

## 2022-06-06 DIAGNOSIS — Z79810 Long term (current) use of selective estrogen receptor modulators (SERMs): Secondary | ICD-10-CM | POA: Insufficient documentation

## 2022-06-06 DIAGNOSIS — Z170 Estrogen receptor positive status [ER+]: Secondary | ICD-10-CM | POA: Diagnosis not present

## 2022-06-06 DIAGNOSIS — Z8542 Personal history of malignant neoplasm of other parts of uterus: Secondary | ICD-10-CM | POA: Insufficient documentation

## 2022-06-06 DIAGNOSIS — C50912 Malignant neoplasm of unspecified site of left female breast: Secondary | ICD-10-CM | POA: Insufficient documentation

## 2022-06-06 DIAGNOSIS — Z9221 Personal history of antineoplastic chemotherapy: Secondary | ICD-10-CM | POA: Insufficient documentation

## 2022-06-06 LAB — COMPREHENSIVE METABOLIC PANEL
ALT: 20 U/L (ref 0–44)
AST: 28 U/L (ref 15–41)
Albumin: 3.8 g/dL (ref 3.5–5.0)
Alkaline Phosphatase: 33 U/L — ABNORMAL LOW (ref 38–126)
Anion gap: 5 (ref 5–15)
BUN: 16 mg/dL (ref 8–23)
CO2: 23 mmol/L (ref 22–32)
Calcium: 8.9 mg/dL (ref 8.9–10.3)
Chloride: 109 mmol/L (ref 98–111)
Creatinine, Ser: 0.86 mg/dL (ref 0.44–1.00)
GFR, Estimated: >60 mL/min (ref >60)
Glucose, Bld: 107 mg/dL — ABNORMAL HIGH (ref 70–99)
Potassium: 3.9 mmol/L (ref 3.5–5.1)
Sodium: 137 mmol/L (ref 135–145)
Total Bilirubin: 1 mg/dL (ref 0.3–1.2)
Total Protein: 6.6 g/dL (ref 6.5–8.1)

## 2022-06-06 LAB — CBC WITH DIFFERENTIAL/PLATELET
Abs Immature Granulocytes: 0.03 10*3/uL (ref 0.00–0.07)
Basophils Absolute: 0.1 10*3/uL (ref 0.0–0.1)
Basophils Relative: 1 %
Eosinophils Absolute: 0.1 10*3/uL (ref 0.0–0.5)
Eosinophils Relative: 1 %
HCT: 38.1 % (ref 36.0–46.0)
Hemoglobin: 12.6 g/dL (ref 12.0–15.0)
Immature Granulocytes: 1 %
Lymphocytes Relative: 39 %
Lymphs Abs: 2.6 10*3/uL (ref 0.7–4.0)
MCH: 31.7 pg (ref 26.0–34.0)
MCHC: 33.1 g/dL (ref 30.0–36.0)
MCV: 96 fL (ref 80.0–100.0)
Monocytes Absolute: 0.6 10*3/uL (ref 0.1–1.0)
Monocytes Relative: 9 %
Neutro Abs: 3.3 10*3/uL (ref 1.7–7.7)
Neutrophils Relative %: 49 %
Platelets: 209 10*3/uL (ref 150–400)
RBC: 3.97 MIL/uL (ref 3.87–5.11)
RDW: 13 % (ref 11.5–15.5)
WBC: 6.7 10*3/uL (ref 4.0–10.5)
nRBC: 0 % (ref 0.0–0.2)

## 2022-06-06 NOTE — Assessment & Plan Note (Signed)
history of endometrial cancer- surgery- carbo taxol and brachy therapy 2012

## 2022-06-06 NOTE — Assessment & Plan Note (Signed)
continue calcium and vitamin D supplementation. 

## 2022-06-06 NOTE — Assessment & Plan Note (Addendum)
stage IV/local recurrent breast cancer-local chest wall recurrence, ER/PR 95%, HER-2 negative. S/p resection, positive margin, s/p radiation.  Labs are reviewed and discussed with patient. Continue Tamoxifen 40m daily. Aspirin 840mdaily. Repeat PET in 3 months.

## 2022-06-06 NOTE — Progress Notes (Signed)
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 S/p resection, positive margin, s/p radiation.  Labs are reviewed and discussed with patient. Continue Tamoxifen 57m daily. Aspirin 825mdaily. Repeat PET in 3 months.  Malignant neoplasm of endometrium ([place] surgery- carbo taxol and brachy therapy 2012  Osteoporosis without current pathological fracture continue calcium and vitamin D supplementation  Orders Placed This Encounter  Procedures   NM PET Image Restage (PS) Skull Base to Thigh (F-18 FDG)    Standing Status:   Future    Standing Expiration Date:   06/07/2023    Order Specific Question:   If indicated for the ordered procedure, I authorize the administration of a radiopharmaceutical per Radiology protocol    Answer:   Yes    Order Specific Question:   Preferred imaging location?    Answer:   [facility]    Order Specific Question:   Radiology Contrast Protocol - do NOT remove file path    Answer:   [email or web address]   Comprehensive metabolic panel    Standing Status:   Future    Standing Expiration Date:   06/06/2023   CBC with Differential/Platelet    Standing Status:   Future    Standing Expiration Date:   06/07/2023   Follow up  PET restaging early September 2023.  Lab cbc cmp MD 3 months  All questions were answered. The patient knows to call the clinic with any problems, questions or concerns.  ZhEarlie ServerMD, PhD [facility] 06/06/2022   CHIEF COMPLAINTS/REASON FOR VISIT:  metastatic breast cancer  HISTORY OF PRESENTING ILLNESS:   Cynthia TERCEROs a  [age].o.  female with PMH listed below was seen in consultation  at the request of  [place] PinkMD  for evaluation of metastatic breast cancer Oncology History Overview Note  Carcinoma of uterusStatus post hysterectomy and bilateral salpingo-oophorectomy, pelvic and aortic lymph node dissection.  (June 15 of 2012) [facility] So[facility] STARTED ON CHEMOTHERAPY WITH CARBOPLATINUM AND TAXOL.  IN [place] 2012 patient finished carboplatinum and Taxol in November of 2012   Also had a brachii therapy site treated vaginal cuff Tumor dose 2350 cGy in 5 fractions Start date 08/31/11 Complete date October 50,012 Using iridium 192 high-dose rate remote afterloading BrachyVision treatment planning  2.  History of carcinoma of breast in 1987, and in 1988, bilateral mastectomy, followed by 5 years of anti-hormonal treatment with tamoxifen.   [place]cer of [place]rium ([place] 05[place] Initial Diagnosis   Cancer of endometrium   Metastatic breast cancer  11/16/2020 Initial Diagnosis   Metastatic breast cancer   -she has a history of breast cancer in 1987/1988, status post bilateral mastectomy followed by tamoxifen for 5 years. -10/26/2020 chest wall biopsy by dermatology Biopsy showed dermal carcinoma consistent with metastatic breast carcinoma.  ER 90%, PR 90%, HER-2 IHC equivocal, FISH negative.     12/08/2020 Imaging   PET showed 1. Hypermetabolic left paracentral chest wall mass consistent with known recurrent breast cancer. 2. No other metastatic disease is identified.   01/02/2021 Imaging   MRI brain w wo contrast  1. No evidence of intracranial metastatic disease. 2. Small amount of scattered foci of T2 hyperintensity within  the white matter of the cerebral hemispheres nonspecific but may represent mild chronic microvascular ischemic changes.3. Moderate parenchymal volume loss.   01/18/2021 Surgery   chest wall resection-Dr. Bary Castilla Consistent with metastatic carcinoma, morphologically compatible with the patient's reported  history of breast carcinoma. Posterior lateral margin is involved by tumor.  Intradermal nevus benign.   01/26/2021 Cancer Staging   Staging form: Breast, AJCC 8th Edition - Clinical: Stage IV (cTX, cNX, cM1, ER+, PR+, HER2-) - Signed by Earlie Server, MD on 01/26/2021   02/20/2021 - 04/03/2021 Radiation Therapy   Chest wall recurrence radiation.    04/10/2021 -  Anti-estrogen oral therapy   Started on Tamoxifen   04/04/2022 Imaging   PET scan showed 1. Interval resection or resolution of previously described anterior left chest wall recurrence. Low-level, non malignant range activity in this region is likely treatment related. 2. No findings of hypermetabolic recurrent or metastatic disease     INTERVAL HISTORY Cynthia [name] is a 81 y.o. female who has above history reviewed by me today presents for follow up visit for management of breast cancer local chest wall recurrence She reports feeling well. No new complaints. She takes Tamoxifen daily.   Review of Systems  Constitutional:  Negative for appetite change, chills, fatigue and fever.  HENT:   Negative for hearing loss and voice change.   Eyes:  Negative for eye problems.  Respiratory:  Negative for chest tightness and cough.   Cardiovascular:  Negative for chest pain.  Gastrointestinal:  Negative for abdominal distention, abdominal pain and blood in stool.  Endocrine: Negative for hot flashes.  Genitourinary:  Negative for difficulty urinating and frequency.   Musculoskeletal:  Negative for arthralgias.  Skin:  Negative for itching and rash.  Neurological:  Negative for extremity weakness.  Hematological:  Negative for adenopathy.  Psychiatric/Behavioral:  Negative for confusion.     MEDICAL HISTORY:  Past Medical History:  Diagnosis Date   Arthritis    Breast cancer ([employer])    Hypothyroidism    Insomnia    Metastatic breast carcinoma 10/26/2020    chest-medial  DERMAL CARCINOMA CONSISTENT WITH METASTATIC BREAST CARCINOMA,    Osteoporosis    Squamous cell carcinoma    RT ARM   Uterine cancer ([place])     SURGICAL HISTORY: Past Surgical History:  Procedure Laterality Date   ABDOMINAL HYSTERECTOMY  05/2011   BSO- RADICAL AND CHEMOTHERAPY COMPLETE    MASS EXCISION Left 01/18/2021   Procedure: EXCISION MASS LEFT CHEST WALL;  Surgeon: Robert Bellow, MD;  Location: [facility] ORS;  Service: General;  Laterality: Left;  left chest wall breast cancer   MASTECTOMY     LEFT WITH AXILLARY NODE DISSECTION, RADICAL RIGHT PROPHYLATIC W/O AXILLARY DISSECTION   PLACEMENT OF BREAST IMPLANTS     SILICONE RECONSTRUCTION    SOCIAL HISTORY: Social History   Socioeconomic History   Marital status: Married    Spouse name: Not on file   Number of children: Not on file   Years of education: Not on file   Highest education level: Not on file  Occupational History   Not on file  Tobacco Use   Smoking status: Never   Smokeless tobacco: Never  Vaping Use   Vaping Use: Never used  Substance and Sexual Activity   Alcohol use: No    Alcohol/week: 0.0 standard drinks of alcohol   Drug use: No   Sexual activity: Yes    Birth control/protection: Post-menopausal, Surgical  Other Topics Concern  Not on file  Social History Narrative   Not on file   Social Determinants of Health   Financial Resource Strain: Not on file  Food Insecurity: Not on file  Transportation Needs: Not on file  Physical Activity: Not on file  Stress: Not on file  Social Connections: Not on file  Intimate Partner Violence: Not on file    FAMILY HISTORY: Family History  Problem Relation Age of Onset   Osteoporosis Mother    Dementia Mother    Hypertension Father    Heart attack Father    Alzheimer's disease Brother    Diabetes Neg Hx    Cancer Neg Hx     ALLERGIES:  is allergic to sulfa antibiotics.  MEDICATIONS:  Current Outpatient Medications  Medication Sig Dispense Refill   aspirin EC 81 MG tablet Take 1 tablet (81 mg total) by mouth  daily. Swallow whole. 90 tablet 1   CALCIUM-VITAMIN D PO Take 1 tablet by mouth daily. 1200 mg Calcium 25 mcg Vit D     L-Theanine 200 MG CAPS Take 200 mg by mouth every evening.     levothyroxine (SYNTHROID) 75 MCG tablet Take 50 mcg by mouth daily before breakfast.     meloxicam (MOBIC) 15 MG tablet Take 15 mg by mouth daily as needed for pain.     Multiple Vitamins-Minerals (CENTRUM SILVER PO) Take 1 tablet by mouth daily.     Multiple Vitamins-Minerals (PRESERVISION AREDS PO) Take 1 tablet by mouth daily.     naproxen sodium (ALEVE) 220 MG tablet Take 220 mg by mouth daily as needed (Pain). Liquid gel     tamoxifen (NOLVADEX) 20 MG tablet Take 1 tablet (20 mg total) by mouth daily. 90 tablet 1   zolpidem (AMBIEN) 10 MG tablet Take 10 mg by mouth at bedtime.     No current facility-administered medications for this visit.     PHYSICAL EXAMINATION: ECOG PERFORMANCE STATUS: 1 - Symptomatic but completely ambulatory Vitals:   06/06/22 1308  BP: 116/88  Pulse: 89  Temp: 98 F (36.7 C)   Filed Weights   06/06/22 1308  Weight: 127 lb (57.6 kg)    Physical Exam Constitutional:      General: She is not in acute distress. HENT:     Head: Normocephalic and atraumatic.  Eyes:     General: No scleral icterus. Cardiovascular:     Rate and Rhythm: Normal rate and regular rhythm.     Heart sounds: Normal heart sounds.  Pulmonary:     Effort: Pulmonary effort is normal. No respiratory distress.     Breath sounds: No wheezing.  Abdominal:     General: Bowel sounds are normal. There is no distension.     Palpations: Abdomen is soft.  Musculoskeletal:        General: No deformity. Normal range of motion.     Cervical back: Normal range of motion and neck supple.  Skin:    General: Skin is warm and dry.     Findings: No erythema or rash.  Neurological:     Mental Status: She is alert and oriented to person, place, and time. Mental status is at baseline.     Cranial Nerves: No  cranial nerve deficit.     Coordination: Coordination normal.  Psychiatric:        Mood and Affect: Mood normal.       LABORATORY DATA:  I have reviewed the data as listed Lab Results  Component Value Date  WBC 6.7 06/06/2022   HGB 12.6 06/06/2022   HCT 38.1 06/06/2022   MCV 96.0 06/06/2022   PLT 209 06/06/2022   Recent Labs    07/28/21 1117 01/29/22 0940 06/06/22 1245  NA 138 137 137  K 4.0 3.7 3.9  CL 107 107 109  CO2 24 24 23   GLUCOSE 120* 116* 107*  BUN 20 14 16   CREATININE 0.81 0.66 0.86  CALCIUM 8.9 9.0 8.9  GFRNONAA >60 >60 >60  PROT 6.5 6.9 6.6  ALBUMIN 3.8 4.0 3.8  AST 24 24 28   ALT 20 16 20   ALKPHOS 32* 61 33*  BILITOT 0.9 0.6 1.0    Iron/TIBC/Ferritin/ %Sat No results found for: "IRON", "TIBC", "FERRITIN", "IRONPCTSAT"    RADIOGRAPHIC STUDIES: I have personally reviewed the radiological images as listed and agreed with the findings in the report. NM PET Image Restage (PS) Skull Base to Thigh (F-18 FDG)  Result Date: 04/04/2022 CLINICAL DATA:  Subsequent treatment strategy for restaging of metastatic breast cancer. Prior mastectomy, chemotherapy and radiation therapy. Uterine cancer with prior partial hysterectomy. EXAM: NUCLEAR MEDICINE PET SKULL BASE TO THIGH TECHNIQUE: 7.0 mCi F-18 FDG was injected intravenously. Full-ring PET imaging was performed from the skull base to thigh after the radiotracer. CT data was obtained and used for attenuation correction and anatomic localization. Fasting blood glucose: 95 mg/dl COMPARISON:  12/08/2020 FINDINGS: Mediastinal blood pool activity: SUV max 2.1 Liver activity: SUV max NA NECK: No areas of abnormal hypermetabolism. Incidental CT findings: Bilateral carotid atherosclerosis. No cervical adenopathy. CHEST: The anterior left chest wall recurrence has resolved or been resected. Low-level hypermetabolism in this region including at a S.U.V. max of 1.2. No CT correlate. No pulmonary parenchymal or thoracic nodal  hypermetabolism. Incidental CT findings: Aortic and coronary artery calcification. Mild cardiomegaly. No axillary, subpectoral, internal mammary adenopathy. ABDOMEN/PELVIS: No abdominopelvic parenchymal or nodal hypermetabolism. Incidental CT findings: Normal adrenal glands. Normal noncontrast appearance of the liver, gallbladder, spleen, pancreas, kidneys, stomach. Extensive colonic diverticulosis. Normal small bowel caliber. Abdominal aortic atherosclerosis. No abdominopelvic adenopathy. Hysterectomy. Pessary in place. SKELETON: No abnormal marrow activity. Incidental CT findings: Osteopenia. Bilateral breast implants with right worse than left intracapsular rupture, chronic. IMPRESSION: 1. Interval resection or resolution of previously described anterior left chest wall recurrence. Low-level, non malignant range activity in this region is likely treatment related. 2. No findings of hypermetabolic recurrent or metastatic disease. 3.  Aortic Atherosclerosis (ICD10-I70.0). Electronically Signed   By: Abigail Miyamoto M.D.   On: 04/04/2022 10:38

## 2022-07-05 ENCOUNTER — Telehealth: Payer: Self-pay | Admitting: *Deleted

## 2022-07-10 ENCOUNTER — Ambulatory Visit: Payer: Medicare Other | Admitting: Podiatry

## 2022-07-10 DIAGNOSIS — M79674 Pain in right toe(s): Secondary | ICD-10-CM

## 2022-07-10 DIAGNOSIS — M79675 Pain in left toe(s): Secondary | ICD-10-CM | POA: Diagnosis not present

## 2022-07-10 DIAGNOSIS — B351 Tinea unguium: Secondary | ICD-10-CM | POA: Diagnosis not present

## 2022-07-10 NOTE — Progress Notes (Signed)
   SUBJECTIVE Patient presents to office today complaining of elongated, thickened nails that cause pain while ambulating in shoes.  Patient is unable to trim their own nails. Patient is here for further evaluation and treatment.  Past Medical History:  Diagnosis Date   Arthritis    Breast cancer ([place])    Hypothyroidism    Insomnia    Metastatic breast carcinoma 10/26/2020    chest-medial  DERMAL CARCINOMA CONSISTENT WITH METASTATIC BREAST CARCINOMA,   Osteoporosis    Squamous cell carcinoma    RT ARM   Uterine cancer ([place])     OBJECTIVE General Patient is awake, alert, and oriented x 3 and in no acute distress. Derm Skin is dry and supple bilateral. Negative open lesions or macerations. Remaining integument unremarkable. Nails are tender, long, thickened and dystrophic with subungual debris, consistent with onychomycosis, 1-5 bilateral. No signs of infection noted. Vasc  DP and PT pedal pulses palpable bilaterally. Temperature gradient within normal limits.  Neuro Epicritic and protective threshold sensation grossly intact bilaterally.  Musculoskeletal Exam No symptomatic pedal deformities noted bilateral. Muscular strength within normal limits.  ASSESSMENT 1.  Pain due to onychomycosis of toenails both  PLAN OF CARE 1. Patient evaluated today.  2. Instructed to maintain good pedal hygiene and foot care.  3. Mechanical debridement of nails 1-5 bilaterally performed using a nail nipper. Filed with dremel without incident.  4. Return to clinic in 3 mos.    Edrick Kins, DPM [facility]  Dr. Edrick Kins, DPM    [address], [place] [address]                Office [phone]  Fax [phone]

## 2022-07-23 ENCOUNTER — Telehealth: Payer: Self-pay

## 2022-07-23 ENCOUNTER — Other Ambulatory Visit: Payer: Self-pay | Admitting: Podiatry

## 2022-07-23 MED ORDER — DOXYCYCLINE HYCLATE 100 MG PO TABS: 100.0000 mg | ORAL_TABLET | Freq: Two times a day (BID) | ORAL | 0 refills | Status: AC

## 2022-07-23 NOTE — Telephone Encounter (Signed)
I sent in a prescription for doxycycline 100 mg 2 times daily x 10 days. Larene Beach or Estill Bamberg could you please notify the patient and schedule a sooner appointment?  Thanks, Dr. Amalia Hailey

## 2022-07-23 NOTE — Telephone Encounter (Signed)
Noted, thanks!

## 2022-08-03 ENCOUNTER — Ambulatory Visit: Payer: Medicare Other | Admitting: Podiatry

## 2022-08-03 DIAGNOSIS — M79674 Pain in right toe(s): Secondary | ICD-10-CM | POA: Diagnosis not present

## 2022-08-03 DIAGNOSIS — M79675 Pain in left toe(s): Secondary | ICD-10-CM

## 2022-08-03 DIAGNOSIS — B351 Tinea unguium: Secondary | ICD-10-CM

## 2022-08-03 NOTE — Progress Notes (Signed)
   SUBJECTIVE Patient presents to office today complaining of elongated, thickened nails that cause pain while ambulating in shoes.  Patient is unable to trim their own nails. Patient is here for further evaluation and treatment.  Past Medical History:  Diagnosis Date   Arthritis    Breast cancer ([place])    Hypothyroidism    Insomnia    Metastatic breast carcinoma 10/26/2020    chest-medial  DERMAL CARCINOMA CONSISTENT WITH METASTATIC BREAST CARCINOMA,   Osteoporosis    Squamous cell carcinoma    RT ARM   Uterine cancer ([place])     OBJECTIVE General Patient is awake, alert, and oriented x 3 and in no acute distress. Derm Skin is dry and supple bilateral. Negative open lesions or macerations. Remaining integument unremarkable. Nails are tender, long, thickened and dystrophic with subungual debris, consistent with onychomycosis, 1-5 bilateral. No signs of infection noted. Vasc  DP and PT pedal pulses palpable bilaterally. Temperature gradient within normal limits.  Neuro Epicritic and protective threshold sensation grossly intact bilaterally.  Musculoskeletal Exam No symptomatic pedal deformities noted bilateral. Muscular strength within normal limits.  ASSESSMENT 1.  Pain due to onychomycosis of toenails both  PLAN OF CARE 1. Patient evaluated today.  2. Instructed to maintain good pedal hygiene and foot care.  3. Mechanical debridement of nails 1-5 bilaterally performed using a nail nipper. Filed with dremel without incident.  4. Return to clinic in 3 mos.    Edrick Kins, DPM [facility]  Dr. Edrick Kins, DPM    [address], [place] [address]                Office [phone]  Fax [phone]

## 2022-08-06 NOTE — Telephone Encounter (Signed)
Error message

## 2022-08-14 ENCOUNTER — Other Ambulatory Visit: Payer: Self-pay

## 2022-08-14 DIAGNOSIS — C50919 Malignant neoplasm of unspecified site of unspecified female breast: Secondary | ICD-10-CM

## 2022-09-06 ENCOUNTER — Ambulatory Visit: Admission: RE | Admit: 2022-09-06 | Payer: Medicare Other | Source: Ambulatory Visit

## 2022-09-06 ENCOUNTER — Other Ambulatory Visit: Payer: Medicare Other

## 2022-09-10 ENCOUNTER — Inpatient Hospital Stay: Payer: Medicare Other | Attending: Oncology

## 2022-09-10 ENCOUNTER — Inpatient Hospital Stay: Payer: Medicare Other | Admitting: Oncology

## 2022-09-10 ENCOUNTER — Encounter: Payer: Self-pay | Admitting: Oncology

## 2022-09-10 VITALS — BP 134/81 | HR 88 | Temp 99.7°F | Resp 16 | Ht 64.0 in | Wt 129.8 lb

## 2022-09-10 DIAGNOSIS — Z79810 Long term (current) use of selective estrogen receptor modulators (SERMs): Secondary | ICD-10-CM | POA: Diagnosis not present

## 2022-09-10 DIAGNOSIS — M810 Age-related osteoporosis without current pathological fracture: Secondary | ICD-10-CM | POA: Diagnosis not present

## 2022-09-10 DIAGNOSIS — E039 Hypothyroidism, unspecified: Secondary | ICD-10-CM | POA: Insufficient documentation

## 2022-09-10 DIAGNOSIS — C50919 Malignant neoplasm of unspecified site of unspecified female breast: Secondary | ICD-10-CM | POA: Insufficient documentation

## 2022-09-10 DIAGNOSIS — C541 Malignant neoplasm of endometrium: Secondary | ICD-10-CM | POA: Diagnosis not present

## 2022-09-10 DIAGNOSIS — Z8542 Personal history of malignant neoplasm of other parts of uterus: Secondary | ICD-10-CM | POA: Insufficient documentation

## 2022-09-10 DIAGNOSIS — Z9013 Acquired absence of bilateral breasts and nipples: Secondary | ICD-10-CM | POA: Insufficient documentation

## 2022-09-10 DIAGNOSIS — C7989 Secondary malignant neoplasm of other specified sites: Secondary | ICD-10-CM

## 2022-09-10 DIAGNOSIS — Z90722 Acquired absence of ovaries, bilateral: Secondary | ICD-10-CM | POA: Diagnosis not present

## 2022-09-10 DIAGNOSIS — G4700 Insomnia, unspecified: Secondary | ICD-10-CM | POA: Insufficient documentation

## 2022-09-10 DIAGNOSIS — C792 Secondary malignant neoplasm of skin: Secondary | ICD-10-CM | POA: Insufficient documentation

## 2022-09-10 DIAGNOSIS — Z90712 Acquired absence of cervix with remaining uterus: Secondary | ICD-10-CM | POA: Insufficient documentation

## 2022-09-10 LAB — COMPREHENSIVE METABOLIC PANEL
ALT: 20 U/L (ref 0–44)
AST: 29 U/L (ref 15–41)
Albumin: 3.4 g/dL — ABNORMAL LOW (ref 3.5–5.0)
Alkaline Phosphatase: 31 U/L — ABNORMAL LOW (ref 38–126)
Anion gap: 2 — ABNORMAL LOW (ref 5–15)
BUN: 16 mg/dL (ref 8–23)
CO2: 25 mmol/L (ref 22–32)
Calcium: 8.7 mg/dL — ABNORMAL LOW (ref 8.9–10.3)
Chloride: 113 mmol/L — ABNORMAL HIGH (ref 98–111)
Creatinine, Ser: 0.89 mg/dL (ref 0.44–1.00)
GFR, Estimated: >60 mL/min (ref >60)
Glucose, Bld: 114 mg/dL — ABNORMAL HIGH (ref 70–99)
Potassium: 4.2 mmol/L (ref 3.5–5.1)
Sodium: 140 mmol/L (ref 135–145)
Total Bilirubin: 0.8 mg/dL (ref 0.3–1.2)
Total Protein: 6.1 g/dL — ABNORMAL LOW (ref 6.5–8.1)

## 2022-09-10 LAB — CBC WITH DIFFERENTIAL/PLATELET
Abs Immature Granulocytes: 0.02 10*3/uL (ref 0.00–0.07)
Basophils Absolute: 0.1 10*3/uL (ref 0.0–0.1)
Basophils Relative: 1 %
Eosinophils Absolute: 0 10*3/uL (ref 0.0–0.5)
Eosinophils Relative: 1 %
HCT: 36.1 % (ref 36.0–46.0)
Hemoglobin: 12.1 g/dL (ref 12.0–15.0)
Immature Granulocytes: 0 %
Lymphocytes Relative: 33 %
Lymphs Abs: 2 10*3/uL (ref 0.7–4.0)
MCH: 32 pg (ref 26.0–34.0)
MCHC: 33.5 g/dL (ref 30.0–36.0)
MCV: 95.5 fL (ref 80.0–100.0)
Monocytes Absolute: 0.5 10*3/uL (ref 0.1–1.0)
Monocytes Relative: 8 %
Neutro Abs: 3.4 10*3/uL (ref 1.7–7.7)
Neutrophils Relative %: 57 %
Platelets: 192 10*3/uL (ref 150–400)
RBC: 3.78 MIL/uL — ABNORMAL LOW (ref 3.87–5.11)
RDW: 13.1 % (ref 11.5–15.5)
WBC: 6 10*3/uL (ref 4.0–10.5)
nRBC: 0 % (ref 0.0–0.2)

## 2022-09-10 NOTE — Assessment & Plan Note (Signed)
history of endometrial cancer- surgery- carbo taxol and brachy therapy 2012

## 2022-09-10 NOTE — Assessment & Plan Note (Signed)
stage IV/local recurrent breast cancer-local chest wall recurrence, ER/PR 95%, HER-2 negative. S/p resection, positive margin, s/p radiation.  Labs are reviewed and discussed with patient. Continue Tamoxifen 20mg daily. Aspirin 81mg daily. Obtain imaging to evaluate treatment response.  

## 2022-09-10 NOTE — Progress Notes (Signed)
Hematology/Oncology Progress note [phone] [phone]      Patient Care Team: [name], MD as PCP - General (Family Medicine)  ASSESSMENT & PLAN:   Cancer Staging  Primary malignant neoplasm of breast with metastasis [facility]) Staging form: Breast, AJCC 8th Edition - Clinical: Stage IV (cTX, cNX, cM1, ER+, PR+, HER2-) - Signed by Earlie Server, MD on 01/26/2021   Primary malignant neoplasm of breast with metastasis ([place]) stage IV/local recurrent breast cancer-local chest wall recurrence, ER/PR 95%, HER-2 negative. S/p resection, positive margin, s/p radiation.  Labs are reviewed and discussed with patient. Continue Tamoxifen 57m daily. Aspirin 842mdaily. Obtain imaging to evaluate treatment response.   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 The patient knows to call the clinic with any problems, questions or concerns.  ZhEarlie ServerMD, PhD [facility]cology 09/10/2022   CHIEF COMPLAINTS/REASON FOR VISIT:  metastatic breast cancer  HISTORY OF PRESENTING ILLNESS:   SuRAYAN DYALs a  [age].o.  female with PMH listed below was seen in consultation at the request of  HeMaryland PinkMD  for evaluation of metastatic breast cancer Oncology History Overview Note  Carcinoma of uterusStatus post hysterectomy and bilateral salpingo-oophorectomy, pelvic and aortic lymph node dissection.  (June 15 of 2012) [facility] STARTED  ON CHEMOTHERAPY WITH CARBOPLATINUM AND TAXOL.  IN AUAugustF 2012 patient finished carboplatinum and Taxol in November of 2012   Also had a brachii therapy site treated vaginal cuff Tumor dose 2350 cGy in 5 fractions Start date 08/31/11 Complete date October 50,012 Using iridium 192 high-dose rate remote afterloading BrachyVision treatment planning  2.  History of carcinoma of breast in 1987, and in 1988, bilateral mastectomy, followed by 5 years of anti-hormonal treatment with tamoxifen.   Cancer of endometrium ([place] 05/14/2011 Initial Diagnosis   Cancer of endometrium   Primary malignant neoplasm of breast with metastasis ([facility] 11/16/2020 Initial Diagnosis   Metastatic breast cancer   -she has a history of breast cancer in 1987/1988, status post bilateral mastectomy followed by tamoxifen for 5 years. -10/26/2020 chest wall biopsy by dermatology Biopsy showed dermal carcinoma consistent with metastatic breast carcinoma.  ER 90%, PR 90%, HER-2 IHC equivocal, FISH negative.     12/08/2020 Imaging   PET showed 1. Hypermetabolic left paracentral chest wall mass consistent with known recurrent breast cancer. 2. No other metastatic disease is identified.   01/02/2021 Imaging   MRI brain w wo contrast  1. No evidence of intracranial metastatic disease. 2. Small amount of scattered foci of T2 hyperintensity within the white matter of the cerebral hemispheres nonspecific but may represent mild chronic microvascular ischemic changes.3. Moderate parenchymal volume loss.   01/18/2021 Surgery   chest wall resection-Dr. ByBary Castillaonsistent with metastatic carcinoma, morphologically compatible with the patient's reported history of breast carcinoma. Posterior lateral margin is involved by tumor.  Intradermal nevus benign.   01/26/2021 Cancer Staging  Staging form: Breast, AJCC 8th Edition - Clinical: Stage IV (cTX, cNX, cM1, ER+, PR+, HER2-) - Signed by Earlie Server, MD on 01/26/2021   02/20/2021 -  04/03/2021 Radiation Therapy   Chest wall recurrence radiation.    04/10/2021 -  Anti-estrogen oral therapy   Started on Tamoxifen   04/04/2022 Imaging   PET scan showed 1. Interval resection or resolution of previously described anterior left chest wall recurrence. Low-level, non malignant range activity in this region is likely treatment related. 2. No findings of hypermetabolic recurrent or metastatic disease     INTERVAL HISTORY KHANIYA TENAGLIA is a 81 y.o. female who has above history reviewed by me today presents for follow up visit for management of breast cancer local chest wall recurrence She reports feeling well. She takes Tamoxifen daily. She has no new complaints.  She missed her appointment of CT imaging   Review of Systems  Constitutional:  Negative for appetite change, chills, fatigue and fever.  HENT:   Negative for hearing loss and voice change.   Eyes:  Negative for eye problems.  Respiratory:  Negative for chest tightness and cough.   Cardiovascular:  Negative for chest pain.  Gastrointestinal:  Negative for abdominal distention, abdominal pain and blood in stool.  Endocrine: Negative for hot flashes.  Genitourinary:  Negative for difficulty urinating and frequency.   Musculoskeletal:  Negative for arthralgias.  Skin:  Negative for itching and rash.  Neurological:  Negative for extremity weakness.  Hematological:  Negative for adenopathy.  Psychiatric/Behavioral:  Negative for confusion.     MEDICAL HISTORY:  Past Medical History:  Diagnosis Date   Arthritis    Breast cancer ([place])    Hypothyroidism    Insomnia    Metastatic breast carcinoma 10/26/2020    chest-medial  DERMAL CARCINOMA CONSISTENT WITH METASTATIC BREAST CARCINOMA,   Osteoporosis    Squamous cell carcinoma    RT ARM   Uterine cancer ([place])     SURGICAL HISTORY: Past Surgical History:  Procedure Laterality Date   ABDOMINAL HYSTERECTOMY  05/2011   BSO- RADICAL AND CHEMOTHERAPY COMPLETE    MASS  EXCISION Left 01/18/2021   Procedure: EXCISION MASS LEFT CHEST WALL;  Surgeon: Robert Bellow, MD;  Location: [facility] ORS;  Service: General;  Laterality: Left;  left chest wall breast cancer   MASTECTOMY     LEFT WITH AXILLARY NODE DISSECTION, RADICAL RIGHT PROPHYLATIC W/O AXILLARY DISSECTION   PLACEMENT OF BREAST IMPLANTS     SILICONE RECONSTRUCTION    SOCIAL HISTORY: Social History   Socioeconomic History   Marital status: Married    Spouse name: Not on file   Number of children: Not on file   Years of education: Not on file   Highest education level: Not on file  Occupational History   Not on file  Tobacco Use   Smoking status: Never   Smokeless tobacco: Never  Vaping Use   Vaping Use: Never used  Substance and Sexual Activity   Alcohol use: No    Alcohol/week: 0.0 standard drinks of alcohol   Drug use: No   Sexual activity: Yes    Birth control/protection: Post-menopausal, Surgical  Other Topics Concern   Not on file  Social History Narrative   Not on file   Social Determinants of Health   Financial Resource Strain: Not on file  Food Insecurity: Not on file  Transportation Needs: Not on file  Physical Activity: Not on file  Stress: Not on file  Social Connections:  Not on file  Intimate Partner Violence: Not on file    FAMILY HISTORY: Family History  Problem Relation Age of Onset   Osteoporosis Mother    Dementia Mother    Hypertension Father    Heart attack Father    Alzheimer's disease Brother    Diabetes Neg Hx    Cancer Neg Hx     ALLERGIES:  is allergic to sulfa antibiotics.  MEDICATIONS:  Current Outpatient Medications  Medication Sig Dispense Refill   alendronate (FOSAMAX) 70 MG tablet Take 70 mg by mouth once a week.     aspirin EC 81 MG tablet Take 1 tablet (81 mg total) by mouth daily. Swallow whole. 90 tablet 1   CALCIUM-VITAMIN D PO Take 1 tablet by mouth daily. 1200 mg Calcium 25 mcg Vit D     L-Theanine 200 MG CAPS Take 200 mg by  mouth every evening.     levothyroxine (SYNTHROID) 75 MCG tablet Take 50 mcg by mouth daily before breakfast.     meloxicam (MOBIC) 15 MG tablet Take 15 mg by mouth daily as needed for pain.     Multiple Vitamins-Minerals (CENTRUM SILVER PO) Take 1 tablet by mouth daily.     Multiple Vitamins-Minerals (PRESERVISION AREDS PO) Take 1 tablet by mouth daily.     MYRBETRIQ 25 MG TB24 tablet Take 25 mg by mouth daily.     naproxen sodium (ALEVE) 220 MG tablet Take 220 mg by mouth daily as needed (Pain). Liquid gel     tamoxifen (NOLVADEX) 20 MG tablet Take 1 tablet (20 mg total) by mouth daily. 90 tablet 1   zolpidem (AMBIEN) 10 MG tablet Take 10 mg by mouth at bedtime.     doxycycline (VIBRA-TABS) 100 MG tablet Take 1 tablet (100 mg total) by mouth 2 (two) times daily. (Patient not taking: Reported on 09/10/2022) 20 tablet 0   No current facility-administered medications for this visit.     PHYSICAL EXAMINATION: ECOG PERFORMANCE STATUS: 1 - Symptomatic but completely ambulatory Vitals:   09/10/22 1350  BP: 134/81  Pulse: 88  Resp: 16  Temp: 99.7 F (37.6 C)  SpO2: 98%   Filed Weights   09/10/22 1350  Weight: 129 lb 12.8 oz (58.9 kg)    Physical Exam Constitutional:      General: She is not in acute distress. HENT:     Head: Normocephalic and atraumatic.  Eyes:     General: No scleral icterus. Cardiovascular:     Rate and Rhythm: Normal rate and regular rhythm.     Heart sounds: Normal heart sounds.  Pulmonary:     Effort: Pulmonary effort is normal. No respiratory distress.     Breath sounds: No wheezing.  Abdominal:     General: Bowel sounds are normal. There is no distension.     Palpations: Abdomen is soft.  Musculoskeletal:        General: No deformity. Normal range of motion.     Cervical back: Normal range of motion and neck supple.  Skin:    General: Skin is warm and dry.     Findings: No erythema or rash.  Neurological:     Mental Status: She is alert and  oriented to person, place, and time. Mental status is at baseline.     Cranial Nerves: No cranial nerve deficit.     Coordination: Coordination normal.  Psychiatric:        Mood and Affect: Mood normal.    Chest wall showed no  signs of cancer recurrence.    LABORATORY DATA:  I have reviewed the data as listed    Latest Ref Rng & Units 09/10/2022    1:37 PM 06/06/2022   12:45 PM 01/29/2022    9:40 AM  CBC  WBC 4.0 - 10.5 K/uL 6.0  6.7  5.5   Hemoglobin 12.0 - 15.0 g/dL 12.1  12.6  13.2   Hematocrit 36.0 - 46.0 % 36.1  38.1  40.1   Platelets 150 - 400 K/uL 192  209  241       Latest Ref Rng & Units 09/10/2022    1:37 PM 06/06/2022   12:45 PM 01/29/2022    9:40 AM  CMP  Glucose 70 - 99 mg/dL 114  107  116   BUN 8 - 23 mg/dL 16  16  14    Creatinine 0.44 - 1.00 mg/dL 0.89  0.86  0.66   Sodium 135 - 145 mmol/L 140  137  137   Potassium 3.5 - 5.1 mmol/L 4.2  3.9  3.7   Chloride 98 - 111 mmol/L 113  109  107   CO2 22 - 32 mmol/L 25  23  24    Calcium 8.9 - 10.3 mg/dL 8.7  8.9  9.0   Total Protein 6.5 - 8.1 g/dL 6.1  6.6  6.9   Total Bilirubin 0.3 - 1.2 mg/dL 0.8  1.0  0.6   Alkaline Phos 38 - 126 U/L 31  33  61   AST 15 - 41 U/L 29  28  24    ALT 0 - 44 U/L 20  20  16      RADIOGRAPHIC STUDIES: I have personally reviewed the radiological images as listed and agreed with the findings in the report. No results found.

## 2022-09-10 NOTE — Assessment & Plan Note (Signed)
continue calcium and vitamin D supplementation. 

## 2022-09-20 ENCOUNTER — Ambulatory Visit: Payer: Medicare Other

## 2022-09-25 ENCOUNTER — Ambulatory Visit
Admission: RE | Admit: 2022-09-25 | Discharge: 2022-09-25 | Disposition: A | Discharge status: Home / Self Care | Payer: Medicare Other | Source: Ambulatory Visit | Attending: Oncology | Admitting: Oncology

## 2022-09-25 DIAGNOSIS — C7989 Secondary malignant neoplasm of other specified sites: Secondary | ICD-10-CM | POA: Insufficient documentation

## 2022-09-25 DIAGNOSIS — C50919 Malignant neoplasm of unspecified site of unspecified female breast: Secondary | ICD-10-CM | POA: Diagnosis present

## 2022-09-25 MED ORDER — IOHEXOL 300 MG/ML  SOLN
100.0000 mL | Freq: Once | INTRAMUSCULAR | Status: AC | PRN
Start: 2022-09-25 — End: 2022-09-25
  Administered 2022-09-25: 100 mL via INTRAVENOUS

## 2022-10-08 ENCOUNTER — Telehealth: Payer: Self-pay

## 2022-10-08 NOTE — Telephone Encounter (Signed)
-----   Message from Earlie Server, MD sent at 10/07/2022 10:25 PM EDT ----- Let her know that CT result is good. Keep current follow up

## 2022-10-15 ENCOUNTER — Ambulatory Visit: Payer: Medicare Other | Admitting: Podiatry

## 2022-11-06 ENCOUNTER — Ambulatory Visit: Payer: Medicare Other | Admitting: Podiatry

## 2022-12-11 ENCOUNTER — Ambulatory Visit: Payer: Medicare Other | Admitting: Oncology

## 2022-12-11 ENCOUNTER — Other Ambulatory Visit: Payer: Medicare Other

## 2022-12-18 ENCOUNTER — Ambulatory Visit: Payer: Medicare Other | Admitting: Oncology

## 2022-12-18 ENCOUNTER — Other Ambulatory Visit: Payer: Medicare Other

## 2023-03-12 ENCOUNTER — Other Ambulatory Visit: Payer: Self-pay | Admitting: Family Medicine

## 2023-03-12 DIAGNOSIS — M25562 Pain in left knee: Secondary | ICD-10-CM

## 2023-03-18 ENCOUNTER — Ambulatory Visit: Payer: Medicare Other

## 2023-03-19 ENCOUNTER — Ambulatory Visit
Admission: RE | Admit: 2023-03-19 | Discharge: 2023-03-19 | Disposition: A | Discharge status: Home / Self Care | Payer: Medicare Other | Source: Ambulatory Visit | Attending: Family Medicine | Admitting: Family Medicine

## 2023-03-19 DIAGNOSIS — M25562 Pain in left knee: Secondary | ICD-10-CM

## 2023-09-20 ENCOUNTER — Encounter: Payer: Self-pay | Admitting: Emergency Medicine

## 2023-09-20 ENCOUNTER — Emergency Department: Payer: Medicare Other

## 2023-09-20 ENCOUNTER — Other Ambulatory Visit: Payer: Self-pay

## 2023-09-20 ENCOUNTER — Emergency Department
Admission: EM | Admit: 2023-09-20 | Discharge: 2023-09-20 | Disposition: A | Discharge status: Home / Self Care | Payer: Medicare Other | Attending: Emergency Medicine | Admitting: Emergency Medicine

## 2023-09-20 DIAGNOSIS — W1839XA Other fall on same level, initial encounter: Secondary | ICD-10-CM | POA: Insufficient documentation

## 2023-09-20 DIAGNOSIS — M25562 Pain in left knee: Secondary | ICD-10-CM | POA: Diagnosis present

## 2023-09-20 DIAGNOSIS — I509 Heart failure, unspecified: Secondary | ICD-10-CM | POA: Diagnosis not present

## 2023-09-20 NOTE — ED Provider Notes (Signed)
[facility] Provider Note    Event Date/Time   First MD Initiated Contact with Patient 09/20/23 [phone]     (approximate)   History   Joint Swelling   HPI  Cynthia [name] is a 82 y.o. female with history of cancer, CHF, arthritis presents emergency department complaining of left knee pain.  Patient fell on her knee 2 to 3 weeks ago.  States pain and swelling were really bad at first.  Has gotten a little better.  However she was concerned as the pain had continued.  Felt a pop yesterday which did relieve some of the pain.  No numbness or tingling.  States swelling has gone down      Physical Exam   Triage Vital Signs: ED Triage Vitals  Encounter Vitals Group     BP 09/20/23 0848 109/81     Systolic BP Percentile --      Diastolic BP Percentile --      Pulse Rate 09/20/23 0848 84     Resp 09/20/23 0848 20     Temp 09/20/23 0848 98.4 F (36.9 C)     Temp Source 09/20/23 0848 Oral     SpO2 09/20/23 0848 99 %     Weight 09/20/23 0846 129 lb (58.5 kg)     Height 09/20/23 0846 5\' 3"  (1.6 m)     Head Circumference --      Peak Flow --      Pain Score 09/20/23 0846 0     Pain Loc --      Pain Education --      Exclude from Growth Chart --     Most recent vital signs: Vitals:   09/20/23 0848  BP: 109/81  Pulse: 84  Resp: 20  Temp: 98.4 F (36.9 C)  SpO2: 99%     General: Awake, no distress.   CV:  Good peripheral perfusion. regular rate and  rhythm Resp:  Normal effort.  Abd:  No distention.   Other:  Left knee tender posteriorly along the ACL, no swelling appreciated, calf is nontender, neurovascular is intact, skin is intact with no warmth or erythema   ED Results / Procedures / Treatments   Labs (all labs ordered are listed, but only abnormal results are displayed) Labs Reviewed - No data to display   EKG     RADIOLOGY X-ray of the left knee    PROCEDURES:   Procedures   MEDICATIONS ORDERED IN ED: Medications - No  data to display   IMPRESSION / MDM / ASSESSMENT AND PLAN / ED COURSE  I reviewed the triage vital signs and the nursing notes.                              Differential diagnosis includes, but is not limited to, fracture, contusion, sprain, arthritis, DVT  Patient's presentation is most consistent with acute complicated illness / injury requiring diagnostic workup.   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  Did consider DVT.  However the patient is feeling better and there is no erythema or warmth.  There is 1 area of tenderness that is not in the area of the popliteal vein.  She does have full range of motion and is able to bear weight.  Conservative therapy  would be best choice at this time.  Placed her in a knee brace.  She is to follow-up with orthopedics.  Return emergency department if worsening.  She is in agreement treatment plan.  Discharged stable condition.      FINAL CLINICAL IMPRESSION(S) / ED DIAGNOSES   Final diagnoses:  Acute pain of left knee     Rx / DC Orders   ED Discharge Orders     None        Note:  This document was prepared using Dragon voice recognition software and may include unintentional dictation errors.    Faythe Ghee, PA-C 09/20/23 1036    Concha Se, MD 09/20/23 [phone]

## 2023-09-20 NOTE — ED Triage Notes (Signed)
Pt via POV from home. Pt c/o L knee swelling, denies any pain for the past 10 days. Denies any injury. No redness. Denies hx of CHF, denies hx of DVT. Denies any surgeries on that knee. Pt is A&Ox4 and NAD

## 2023-09-20 NOTE — ED Notes (Signed)
See triage note  Presents with pain to left knee  States she had a fall several days ago  and then she may have twisted it   States she has had swelling to left knee,anterior /and posterior
